# Patient Record
Sex: Female | Born: 1970 | Hispanic: Yes | Marital: Married | Smoking: Never smoker
Health system: Southern US, Community
[De-identification: ages and names within clinical notes are randomized; demographics above are authoritative.]

## PROBLEM LIST (undated history)

## (undated) DIAGNOSIS — Z789 Other specified health status: Secondary | ICD-10-CM

## (undated) DIAGNOSIS — D229 Melanocytic nevi, unspecified: Secondary | ICD-10-CM

## (undated) DIAGNOSIS — E559 Vitamin D deficiency, unspecified: Secondary | ICD-10-CM

## (undated) DIAGNOSIS — E785 Hyperlipidemia, unspecified: Secondary | ICD-10-CM

## (undated) HISTORY — DX: Hyperlipidemia, unspecified: E78.5

## (undated) HISTORY — DX: Vitamin D deficiency, unspecified: E55.9

## (undated) HISTORY — DX: Melanocytic nevi, unspecified: D22.9

## (undated) HISTORY — PX: NO PAST SURGERIES: SHX2092

## (undated) HISTORY — PX: CERVICAL POLYPECTOMY: SHX88

---

## 2006-09-03 ENCOUNTER — Emergency Department (HOSPITAL_COMMUNITY): Admission: EM | Admit: 2006-09-03 | Discharge: 2006-09-03 | Payer: Self-pay | Admitting: Family Medicine

## 2006-10-25 ENCOUNTER — Emergency Department (HOSPITAL_COMMUNITY): Admission: EM | Admit: 2006-10-25 | Discharge: 2006-10-26 | Payer: Self-pay | Admitting: Emergency Medicine

## 2010-10-15 ENCOUNTER — Encounter: Payer: Self-pay | Admitting: Obstetrics & Gynecology

## 2011-09-01 ENCOUNTER — Encounter (HOSPITAL_COMMUNITY): Payer: Self-pay

## 2011-09-01 ENCOUNTER — Inpatient Hospital Stay (HOSPITAL_COMMUNITY)
Admission: AD | Admit: 2011-09-01 | Discharge: 2011-09-01 | Disposition: A | Discharge status: Home / Self Care | Payer: PRIVATE HEALTH INSURANCE | Source: Ambulatory Visit | Attending: Obstetrics & Gynecology | Admitting: Obstetrics & Gynecology

## 2011-09-01 ENCOUNTER — Inpatient Hospital Stay (HOSPITAL_COMMUNITY): Payer: PRIVATE HEALTH INSURANCE

## 2011-09-01 DIAGNOSIS — O209 Hemorrhage in early pregnancy, unspecified: Secondary | ICD-10-CM | POA: Insufficient documentation

## 2011-09-01 DIAGNOSIS — R109 Unspecified abdominal pain: Secondary | ICD-10-CM | POA: Insufficient documentation

## 2011-09-01 LAB — URINALYSIS, ROUTINE W REFLEX MICROSCOPIC
Bilirubin Urine: NEGATIVE
Glucose, UA: NEGATIVE mg/dL
Ketones, ur: NEGATIVE mg/dL
Protein, ur: NEGATIVE mg/dL

## 2011-09-01 LAB — ABO/RH: ABO/RH(D): O POS

## 2011-09-01 LAB — HCG, QUANTITATIVE, PREGNANCY: hCG, Beta Chain, Quant, S: 2893 m[IU]/mL — ABNORMAL HIGH (ref <5)

## 2011-09-01 LAB — URINE MICROSCOPIC-ADD ON

## 2011-09-01 LAB — CBC
MCHC: 33.8 g/dL (ref 30.0–36.0)
RDW: 15 % (ref 11.5–15.5)

## 2011-09-01 LAB — WET PREP, GENITAL
Clue Cells Wet Prep HPF POC: NONE SEEN
Trich, Wet Prep: NONE SEEN
Yeast Wet Prep HPF POC: NONE SEEN

## 2011-09-01 NOTE — MAU Note (Signed)
Pt reports having some vaginal bleeding that started yesterday. Reports bleeding is heavier now and having some cramping.

## 2011-09-01 NOTE — MAU Provider Note (Signed)
History     CSN: [id number]  Arrival date and time: 09/01/11 1601   First Provider Initiated Contact with Patient 09/01/11 [phone]      Chief Complaint  Patient presents with  . Vaginal Bleeding   HPI Tina [place] 41 y.o. 6w 3d gestation, having cramping and bleeding today.  No prenatal care yet.  Came for evaluation.  OB History    Grav Para Term Preterm Abortions TAB SAB Ect Mult Living   2 1        1       No past medical history on file.  No past surgical history on file.  No family history on file.  History  Substance Use Topics  . Smoking status: Never Smoker   . Smokeless tobacco: Not on file  . Alcohol Use: No    Allergies: No Known Allergies  Prescriptions prior to admission  Medication Sig Dispense Refill  . Prenatal Vit-Fe Fumarate-FA (PRENATAL MULTIVITAMIN) TABS Take 1 tablet by mouth daily.        Review of Systems  Constitutional: Negative for fever.  Gastrointestinal: Positive for abdominal pain. Negative for nausea, vomiting, diarrhea and constipation.  Genitourinary: Negative for dysuria.       Vaginal bleeding   Physical Exam   Blood pressure 147/79, pulse 98, temperature 99 F (37.2 C), temperature source Oral, resp. rate 18, height 5\' 4"  (1.626 m), weight 151 lb 3.2 oz (68.584 kg), last menstrual period 07/18/2011.  Physical Exam  Nursing note and vitals reviewed. Constitutional: She is oriented to person, place, and time. She appears well-developed and well-nourished.  HENT:  Head: Normocephalic.  Eyes: EOM are normal.  Neck: Neck supple.  GI: Soft. There is no tenderness.  Genitourinary:       Speculum exam: Vulva - negative Vagina - Small amount of blood, no odor Cervix - No contact bleeding, two cervical polyps noted Bimanual exam: Cervix int os closed Uterus non tender, enlarged - 6 week size, to the left in the plevis Adnexa non tender, no masses bilaterally GC/Chlam, wet prep done Chaperone present for exam.    Musculoskeletal: Normal range of motion.  Neurological: She is alert and oriented to person, place, and time.  Skin: Skin is warm and dry.  Psychiatric: She has a normal mood and affect.    MAU Course  Procedures Results for orders placed during the hospital encounter of 09/01/11 (from the past 24 hour(s))  URINALYSIS, ROUTINE W REFLEX MICROSCOPIC     Status: Abnormal   Collection Time   09/01/11  9:02 AM      Component Value Range   Color, Urine YELLOW  YELLOW    APPearance CLEAR  CLEAR    Specific Gravity, Urine 1.010  1.005 - 1.030    pH 6.0  5.0 - 8.0    Glucose, UA NEGATIVE  NEGATIVE (mg/dL)   Hgb urine dipstick MODERATE (*) NEGATIVE    Bilirubin Urine NEGATIVE  NEGATIVE    Ketones, ur NEGATIVE  NEGATIVE (mg/dL)   Protein, ur NEGATIVE  NEGATIVE (mg/dL)   Urobilinogen, UA 0.2  0.0 - 1.0 (mg/dL)   Nitrite NEGATIVE  NEGATIVE    Leukocytes, UA NEGATIVE  NEGATIVE   URINE MICROSCOPIC-ADD ON     Status: Abnormal   Collection Time   09/01/11  9:02 AM      Component Value Range   Squamous Epithelial / LPF FEW (*) RARE    RBC / HPF 7-10  <3 (RBC/hpf)  POCT PREGNANCY, URINE  Status: Abnormal   Collection Time   09/01/11  9:11 AM      Component Value Range   Preg Test, Ur POSITIVE (*) NEGATIVE   WET PREP, GENITAL     Status: Abnormal   Collection Time   09/01/11  9:20 AM      Component Value Range   Yeast Wet Prep HPF POC NONE SEEN  NONE SEEN    Trich, Wet Prep NONE SEEN  NONE SEEN    Clue Cells Wet Prep HPF POC NONE SEEN  NONE SEEN    WBC, Wet Prep HPF POC RARE (*) NONE SEEN   HCG, QUANTITATIVE, PREGNANCY     Status: Abnormal   Collection Time   09/01/11  9:30 AM      Component Value Range   hCG, Beta Chain, Quant, S 2893 (*) <5 (mIU/mL)  CBC     Status: Normal   Collection Time   09/01/11  9:30 AM      Component Value Range   WBC 6.6  4.0 - 10.5 (K/uL)   RBC 4.53  3.87 - 5.11 (MIL/uL)   Hemoglobin 13.4  12.0 - 15.0 (g/dL)   HCT 16.1  09.6 - 04.5 (%)   MCV 87.4  78.0  - 100.0 (fL)   MCH 29.6  26.0 - 34.0 (pg)   MCHC 33.8  30.0 - 36.0 (g/dL)   RDW 40.9  81.1 - 91.4 (%)   Platelets 248  150 - 400 (K/uL)  ABO/RH     Status: Normal   Collection Time   09/01/11  9:30 AM      Component Value Range   ABO/RH(D) O POS     MDM Clinical Data: Vaginal bleeding. Last menstrual period 07/18/2011.  OBSTETRIC <14 WK [place] AND TRANSVAGINAL OB US  Technique: Both transabdominal and transvaginal ultrasound  examinations were performed for complete evaluation of the  gestation as well as the maternal uterus, adnexal regions, and  pelvic cul-de-sac. Transvaginal technique was performed to assess  early pregnancy.  Comparison: No priors.  Intrauterine gestational sac: A single intrauterine gestational  sac is noted, somewhat flattened in appearance, with a mean sac  diameter of approximately 8.6 mm, corresponding with an estimated  gestational age of approximately 5 weeks 4 days.  Yolk sac: Present measuring 4 mm.  Embryo: None visualized.  Cardiac Activity: None observed.  Heart Rate: N/A  Maternal uterus/adnexae:  Multiple small hypoechoic regions are noted in the uterus, likely  representing small fibroids. The largest of these is pedunculated,  and extends into the region of the right adnexa, measuring  approximately 2.6 x 1.8 x 3.1 cm. The ovaries are normal in  echotexture appearance bilaterally, containing multiple small  follicles. Right ovary measures 2.8 x 1.5 x 2.3 cm. The left  ovary measures 3.1 x 1.6 x 2.5 cm. No free fluid in the cul-de-sac.  IMPRESSION:  1. While there is a small gestational sac identified within the  endometrial cavity, the sac is misshapen. There is a yolk sac  visualized, but no fetal pole and no identifiable cardiac activity.  Findings suggest a blighted ovum. Clinical correlation is  recommended.  2. The appearance of the ovaries is normal bilaterally.  3. Multiple small fibroids, the largest of which is pedunculated    extending into the region of the right adnexa measuring  approximately 2.6 x 1.8 x 3.1 cm.   1055  Consult with Dr. Marice Potter  Assessment and Plan  5w 4d IUGS with yolk sac -  misshapen and question if this is a viable pregnancy Bleeding in early pregnancy  Plan No smoking, no drugs, no alcohol.  Take a prenatal vitamin one by mouth every day.  Eat small frequent snacks to avoid nausea.  Begin prenatal care as soon as possible. Repeat ultrasound in one week - order sent to ultrasound to call client   [facility] 09/01/2011, 11:00 AM

## 2011-09-01 NOTE — Discharge Instructions (Signed)
No smoking, no drugs, no alcohol.  Take a prenatal vitamin one by mouth every day.  Eat small frequent snacks to avoid nausea.  Begin prenatal care as soon as possible.  Prenatal Care Providers [facility]    [facility]  Phone- [phone]     Phone: [phone]          [facility]                      [facility]  @[place]     Phone: [phone]  Phone: [phone]         [facility]     Phone: [phone]  Phone: [phone]           [facility] [place]                hone: [phone]  Phone: [phone]         [facility] [address] [address]      Phone: [phone]  Phone: [phone]         [facility]                Phone: [phone]  [facility]   Phone:[phone]    [facility])          Phone: [phone] [facility]   Phone: [phone]      ________________________________________     To schedule your Maternity Eligibility Appointment, please call [phone].  When you arrive for your appointment you must bring the following items or information listed below.  Your appointment will be rescheduled if you do not have these items or are 15 minutes late. If currently receiving Medicaid, you MUST bring: 1. Medicaid Card 2. Social Security Card 3. Picture ID 4. Proof of Pregnancy 5. Verification of current address if the address on Medicaid card is incorrect "postmarked mail" If not receiving Medicaid, you MUST bring: 1. Social Security Card 2. Picture ID 3. Birth Certificate (if available) Passport or *Green Card 4. Proof of Pregnancy 5. Verification of current address "postmarked mail" for each income presented. 6. Verification of insurance coverage, if any 7. Check stubs from each employer for the previous month (if unable to present  check stub  for each week, we will accept check stub for the first and last week ill the same month.) If you can't locate check stubs, you must bring a letter from the employer(s) and it must have the following information on letterhead, typed, in English: o name of company o company telephone number o how long been with the company, if less than one month o how much person earns per hour o how many hours per week work o the gross pay the person earned for the previous month If you are 42 years old or less, you do not have to bring proof of income unless you work or live with the father of the baby and at that time we will need proof of income from you and/or the father of the baby. Green Card recipients are eligible for Medicaid for Pregnant Women (MPW)    

## 2011-09-02 ENCOUNTER — Encounter (HOSPITAL_COMMUNITY): Payer: Self-pay | Admitting: *Deleted

## 2011-09-02 ENCOUNTER — Inpatient Hospital Stay (HOSPITAL_COMMUNITY)
Admission: AD | Admit: 2011-09-02 | Discharge: 2011-09-02 | Disposition: A | Discharge status: Hospice | Payer: 59 | Source: Ambulatory Visit | Attending: Obstetrics & Gynecology | Admitting: Obstetrics & Gynecology

## 2011-09-02 DIAGNOSIS — O039 Complete or unspecified spontaneous abortion without complication: Secondary | ICD-10-CM

## 2011-09-02 DIAGNOSIS — O034 Incomplete spontaneous abortion without complication: Secondary | ICD-10-CM

## 2011-09-02 HISTORY — DX: Other specified health status: Z78.9

## 2011-09-02 LAB — URINALYSIS, ROUTINE W REFLEX MICROSCOPIC
Bilirubin Urine: NEGATIVE
Glucose, UA: NEGATIVE mg/dL
Protein, ur: NEGATIVE mg/dL
Specific Gravity, Urine: <1.005 — ABNORMAL LOW (ref 1.005–1.030)
Urobilinogen, UA: 0.2 mg/dL (ref 0.0–1.0)

## 2011-09-02 LAB — URINE MICROSCOPIC-ADD ON

## 2011-09-02 NOTE — MAU Note (Signed)
Bleeding yesterday, came to MAU, had u/s with IUP identified.  Will come for repeat u/s in one week.  Bleeding worsened today accompanied with lower abd cramping.  At present, bleeding has slowed compared to what it was at home this afternoon.

## 2011-09-02 NOTE — MAU Note (Signed)
About ago, had sudden onset of sharp pain. Bleeding has gotten heavier, no clots.  Pain is not as bad now.

## 2011-09-02 NOTE — ED Provider Notes (Signed)
  History     CSN: [id number]  Arrival date and time: 09/02/11 1533   None     Chief Complaint  Patient presents with  . Abdominal Pain  . Vaginal Bleeding   Patient is a 41 y.o. female presenting with abdominal pain and vaginal bleeding.  Abdominal Pain The primary symptoms of the illness include abdominal pain and vaginal bleeding.  Vaginal Bleeding Associated symptoms include abdominal pain.  Abdominal Pain  Vaginal Bleeding Associated symptoms include abdominal pain.  Bleeding began yesterday and she was evaluated in MAU at that time.  Had work up with US done which showed misshapen sac with no fetal pole, ?blighted ovum.  Last night and earlier today had increasing abdominal pain and bleeding.  This has now tapered to light bleeding and minimal pain.  She is soaking <1 pad per hour.  No associated trauma, nausea, vomiting.    OB History    Grav Para Term Preterm Abortions TAB SAB Ect Mult Living   2 1        1       Past Medical History  Diagnosis Date  . No pertinent past medical history     Past Surgical History  Procedure Date  . No past surgeries     Family History  Problem Relation Age of Onset  . Diabetes Mother   . Hypertension Father   . Arthritis Sister   . Diabetes Maternal Aunt   . Diabetes Maternal Uncle   . Diabetes Paternal Aunt   . Diabetes Paternal Uncle   . Hypertension Paternal Uncle   . Diabetes Maternal Grandmother   . Diabetes Maternal Grandfather     History  Substance Use Topics  . Smoking status: Never Smoker   . Smokeless tobacco: Not on file  . Alcohol Use: No    Allergies: No Known Allergies  Prescriptions prior to admission  Medication Sig Dispense Refill  . Prenatal Vit-Fe Fumarate-FA (PRENATAL MULTIVITAMIN) TABS Take 1 tablet by mouth daily.        Review of Systems  Gastrointestinal: Positive for abdominal pain.  Genitourinary: Positive for vaginal bleeding.   Physical Exam   Blood pressure 119/72, pulse 92,  temperature 98.2 F (36.8 C), temperature source Oral, resp. rate 20, height 5\' 5"  (1.651 m), weight 153 lb (69.4 kg), last menstrual period 07/18/2011, SpO2 100.00%.  Physical Exam  Constitutional: She appears well-developed and well-nourished. No distress.  Genitourinary:       Speculum exam deferred since she had done on 3/31 and bleeding is slowed significantly.     MAU Course  Procedures  MDM - Likely incomplete spontaneous abortion given findings on [place] and subsequent increased bleeding and pain    Assessment and Plan  Incomplete spontaneous abortion   -Will have her return tomorrow am for [place] and repeat quant HCG.  MATTHEWS,CODY 09/02/2011, 4:28 PM  Care assumed by Sharen Counter, CNM  P: D/C home with bleeding precautions Return to MAU tomorrow at 10:15 for repeat quantitative HCG U/S scheduled for 10:45 am tomorrow as outpatient  LEFTWICH-KIRBY, Demarea Lorey Certified Nurse-Midwife

## 2011-09-02 NOTE — ED Provider Notes (Signed)
Attestation of Attending Supervision of Advanced Practitioner: Evaluation and management procedures were performed by the PA/NP/CNM/OB Fellow under my supervision/collaboration. Chart reviewed, and agree with management and plan.  Jaynie Collins, M.D. 09/02/2011 6:50 PM

## 2011-09-02 NOTE — Discharge Instructions (Signed)
Please return to MAU at 10:15 am for labs.  Your ultrasound is scheduled for 10:45 am tomorrow.   Aborto incompleto (Incomplete Miscarriage) Los abortos son algo frecuente en los [place]. Le han diagnosticado que ha sufrido un aborto incompleto. Se trata de un embarazo que ha finalizado antes de la vigsima semana y [place] partes del feto o de la placenta han quedado en el tero. En algunos casos se requiere Pharmacist, community. La razn ms frecuente para Education officer, environmental un tratamiento es el sangrado (hemorragia) continuo que generalmente ocurre. Tambin puede [employer] tejidos que no se han expulsado se infecten. El tratamiento generalmente se realiza con un curetaje. El curetaje se lleva a cabo en el aborto incompleto como procedimiento para extraer los tejidos del embarazo que no se han expulsado. Se realizar por medio de un sencillo procedimiento de succin (curetaje por succin) o por medio de un raspado simple del interior del tero. Podr llevarse a cabo en el hospital o en el consultorio del profesional. Slo se efecta cuando el profesional tiene la certeza de que el embarazo se ha interrumpido. La certeza se determina a travs del examen fsico, una prueba de Psychiatrist negativa y por medio de una ecografa que revele la existencia de un feto muero o partes del embarazo que hubieran quedado en el tero. En ocasiones, cuando el cuello del tero queda dilatado y an se eliminan cogulos y tejidos, el profesional podr esperar para ver si elimina todos los tejidos sin necesidad de Education officer, environmental un procedimiento. Si la hemorragia persiste, podr proceder inmediatamente con el curetaje. POR QU ME SIENTO DE ESTE MODO Un aborto suele ser un perodo muy emotivo para las futuras mams; sin embargo, Engineer, maintenance (IT) consuelo teniendo en cuenta que no es su culpa ni la de su pareja. No ocurre por conductas inapropiadas por parte suya o de su compaero. Casi todos los abortos se producen porque el embarazo ha comenzado  de un modo incorrecto. Al menos en la mitad de estos embarazos, cuando se los estudia, existe una anormalidad cromosmica (casi siempre no hereditaria). Otros podran presentar problemas en el desarrollo del feto o de la placenta, los que no necesariamente se observan en los productos del aborto cuando se estudian al microscopio. El aborto casi nunca se produce por su culpa. Casi siempre puede tratar de embarazarse nuevamente tan pronto como el profesional la autorice. INSTRUCCIONES PARA EL CUIDADO DOMICILIARIO  El profesional puede indicarle reposo en cama (slo podr levantarse para ir al cuarto de bao), o podr permitirle que contine realizando tareas livianas. Si el curetaje no se realiza en [employer], podr Financial risk analyst sus cosas con respecto al cuidado de los nios y a otras responsabilidades en caso de un tratamiento ms intensivo o de hospitalizacin.   Lleve un registro de la cantidad y la saturacin de las toallas higinicas (cunto absorben) que Landscape architect. [place] esta informacin.   No usetampones. No se haga duchas vaginales ni tenga relaciones sexuales hasta que el mdico la autorice.   Es muy importante que cumpla con todas las citas de seguimiento para una nueva evaluacin y para Administrator, Civil Service.   Las mujeres con tipo sanguneo Rh negativo (A, B, AB o 0 negativo deben recibir una droga denominada inmuno globulina Rh (D) (RhoGam ) Este medicamento ayuda a proteger al feto de futuros problemas que puedan ocurrir si una madre Rh negativo da a luz a un beb Rh positivo.  CONCIERTE UNA CITA CON EL MDICO, O CON EL PROFESIONAL AL  QUE LA HAN DERIVADO O VAYA A LA SALA DE EMERGENCIAS INMEDIATAMENTE SI:  Siente clicos intensos en el estmago, en la espalda o en el abdomen.   Presenta una temperatura elevada inexplicable (antela).   Elimina cogulos grandes o tejidos (conserve una parte de los tejidos para [employer] observe).   La hemorragia aumenta  o se siente mareada, dbil o tiene episodios de desmayos.  EST SEGURO QUE:   Comprende las instrucciones para el alta mdica.   Controlar su enfermedad.   Solicitar atencin mdica de inmediato segn las indicaciones.  Document Released: 05/20/2005 Document Revised: 05/09/2011 [facility] Patient Information 2012 [place], [place].

## 2011-09-03 ENCOUNTER — Encounter (HOSPITAL_COMMUNITY): Payer: Self-pay | Admitting: *Deleted

## 2011-09-03 ENCOUNTER — Inpatient Hospital Stay (HOSPITAL_COMMUNITY)
Admission: AD | Admit: 2011-09-03 | Discharge: 2011-09-03 | Disposition: A | Discharge status: Home / Self Care | Payer: Managed Care, Other (non HMO) | Source: Ambulatory Visit | Attending: Family Medicine | Admitting: Family Medicine

## 2011-09-03 ENCOUNTER — Ambulatory Visit (HOSPITAL_COMMUNITY)
Admission: RE | Admit: 2011-09-03 | Discharge: 2011-09-03 | Disposition: A | Discharge status: Home / Self Care | Payer: 59 | Source: Ambulatory Visit | Attending: Obstetrics & Gynecology | Admitting: Obstetrics & Gynecology

## 2011-09-03 DIAGNOSIS — O034 Incomplete spontaneous abortion without complication: Secondary | ICD-10-CM

## 2011-09-03 DIAGNOSIS — O039 Complete or unspecified spontaneous abortion without complication: Secondary | ICD-10-CM

## 2011-09-03 LAB — HCG, QUANTITATIVE, PREGNANCY: hCG, Beta Chain, Quant, S: 942 m[IU]/mL — ABNORMAL HIGH (ref <5)

## 2011-09-03 NOTE — MAU Note (Signed)
Sent over from ultrasound, reports less bleeding today then yesterday, has only used 3 pads that are lightly saturated.  Denies any pain at the moment, occas abdominal cramping.

## 2011-09-03 NOTE — MAU Provider Note (Signed)
History    Pt presents today for f/u after experiencing some pain and bleeding in early pregnancy. She states she is doing well and has had some improvement of her sx.   CSN: [id number]  Arrival date and time: 09/03/11 1111   None     Chief Complaint  Patient presents with  . Vaginal Bleeding   HPI  OB History    Grav Para Term Preterm Abortions TAB SAB Ect Mult Living   2 1        1       Past Medical History  Diagnosis Date  . No pertinent past medical history     Past Surgical History  Procedure Date  . No past surgeries     Family History  Problem Relation Age of Onset  . Diabetes Mother   . Hypertension Father   . Arthritis Sister   . Diabetes Maternal Aunt   . Diabetes Maternal Uncle   . Diabetes Paternal Aunt   . Diabetes Paternal Uncle   . Hypertension Paternal Uncle   . Diabetes Maternal Grandmother   . Diabetes Maternal Grandfather     History  Substance Use Topics  . Smoking status: Never Smoker   . Smokeless tobacco: Not on file  . Alcohol Use: No    Allergies: No Known Allergies  Prescriptions prior to admission  Medication Sig Dispense Refill  . Multiple Vitamins-Minerals (MULTIVITAMIN WITH MINERALS) tablet Take 1 tablet by mouth daily.      . Prenatal Vit-Fe Fumarate-FA (PRENATAL MULTIVITAMIN) TABS Take 1 tablet by mouth daily.        Review of Systems  Constitutional: Negative for fever and chills.  Eyes: Negative for blurred vision and double vision.  Respiratory: Negative for cough, hemoptysis, sputum production, shortness of breath and wheezing.   Cardiovascular: Negative for chest pain and palpitations.  Gastrointestinal: Negative for nausea, vomiting, abdominal pain, diarrhea and constipation.  Genitourinary: Negative for dysuria, urgency and frequency.  Neurological: Negative for dizziness and headaches.  Psychiatric/Behavioral: Negative for depression and suicidal ideas.   Physical Exam   Blood pressure 128/79, pulse 81,  temperature 98.2 F (36.8 C), temperature source Oral, resp. rate 18, height 5\' 5"  (1.651 m), weight 153 lb (69.4 kg), last menstrual period 07/18/2011.  Physical Exam  Nursing note and vitals reviewed. Constitutional: She is oriented to person, place, and time. She appears well-developed and well-nourished. No distress.  HENT:  Head: Normocephalic and atraumatic.  Eyes: EOM are normal. Pupils are equal, round, and reactive to light.  GI: Soft. She exhibits no distension and no mass. There is no tenderness. There is no rebound and no guarding.  Neurological: She is alert and oriented to person, place, and time.  Skin: Skin is warm and dry. She is not diaphoretic.  Psychiatric: She has a normal mood and affect. Her behavior is normal. Judgment and thought content normal.    MAU Course  Procedures  Results for orders placed during the hospital encounter of 09/03/11 (from the past 72 hour(s))  HCG, QUANTITATIVE, PREGNANCY     Status: Abnormal   Collection Time   09/03/11 11:33 AM      Component Value Range Comment   hCG, Beta Chain, Quant, S 942 (*) <5 (mIU/mL)      Assessment and Plan  SAB: pt has had a significant decrease in her B-quant. She will return in 1wk for repeat B-quant. Discussed diet, activity, risks, and precautions.  Clinton Gallant. Kesha Hurrell III, DrHSc, MPAS, PA-C  09/03/2011,  12:33 PM

## 2011-09-03 NOTE — MAU Provider Note (Signed)
Chart reviewed and agree with management and plan.  

## 2011-09-03 NOTE — Discharge Instructions (Signed)
Miscarriage (Spontaneous Miscarriage)  A miscarriage is when you lose your baby before the twentieth week of pregnancy. Miscarriages happen in 15-20% of pregnancies. Most miscarriages happen in the first 13 weeks of the pregnancy. In medical terms, this is called a spontaneous miscarriage or early pregnancy loss. No further treatment is needed when the miscarriage is complete and all products of conception have been passed out of the body. You can begin trying for another pregnancy as soon as your caregiver says it is okay.  CAUSES    Most causes are not known.   Genetic problems like abnormal, not enough or too many chromosomes.   Infection of the cervix or uterus.   An abnormal shaped uterus, fibroid tumors or congenital abnormalities.   Hormone problems.   Medical problems.   Incompetent cervix, the tissue in the cervix is not strong enough to hold the pregnancy.   Smoking, too much alcohol use and illegal drugs.   Trauma.  SYMPTOMS    Bleeding or spotting from the vagina.   Cramping of the lower abdomen.   Passing of fluid from the vagina with or without cramps or pain.   Passing fetal tissue.  TREATMENT    Sometimes no further treatment is necessary if you pass all the tissue in the uterus.   If partial parts of the fetus or placenta remain in the body (incomplete miscarriage), tissue left behind may become infected. Usually a D and C (Dilatation and Curettage) suction or scrapping of the uterus is necessary to remove the remaining tissue in uterus. The procedure is only done when your caregiver knows that there is no chance for the pregnancy to continue. This is determined by a physical exam, a negative pregnancy test, blood tests and perhaps an ultrasound revealing a dead fetus or no fetus developing because a problem occurred at conception (when the sperm and egg unite).   Medications may be necessary, antibiotics if there is an infection or medications to contract the uterus if there is a  lot of bleeding.   If you have Rh negative blood and your partner is Rh positive, you will need a Rho-gam shot (an immune globulin vaccine). This will protect your baby from having Rh blood problems in future pregnancies.  HOME CARE INSTRUCTIONS    Your caregiver may order bed rest (up to the bathroom only). He or she may allow you to continue light activity. You may need to make arrangements for the care of children and for any other responsibilities.   Keep track of the number of pads you use each day and how soaked (saturated) they are. Record this information.   Do not use tampons. Do not douche or have sexual intercourse until approved by your caregiver.   Only take over-the-counter or prescription medicines for pain, discomfort or fever as directed by your caregiver.   Do not take aspirin because it can cause bleeding.   It is very important to keep all follow-up appointments for re-evaluations and continuing management.   Tell your caregiver if you are experiencing domestic violence.   Women who have an Rh negative blood type (i.e., A, B, AB, or O negative) need to receive a drug called Rh(D) immune globulin (RhoGam). This medicine helps protect future fetuses against problems that can occur if an Rh negative mother is carrying a baby who is Rh positive.   If you and/or your partner are having problems with guilt or grieving, talk to your caregiver or seek counseling to help   you cope with the pregnancy loss. Allow enough time to grieve before trying to get pregnant again.  SEEK IMMEDIATE MEDICAL CARE IF:    You have severe cramps or pain in your stomach, back, or belly (abdomen).   You have a fever.   You pass large clots or tissue. Save any tissue for your caregiver to inspect.   Your bleeding increases.   You become light-headed, weak or have fainting episodes.   You develop chills.  Document Released: 11/13/2000 Document Revised: 05/09/2011 Document Reviewed: 12/21/2007  Exit[employer] Patient  Information 2012 [employer].

## 2011-09-11 ENCOUNTER — Encounter (HOSPITAL_COMMUNITY): Payer: Self-pay | Admitting: Advanced Practice Midwife

## 2011-09-11 ENCOUNTER — Inpatient Hospital Stay (HOSPITAL_COMMUNITY)
Admission: AD | Admit: 2011-09-11 | Discharge: 2011-09-11 | Disposition: A | Discharge status: Home / Self Care | Payer: PRIVATE HEALTH INSURANCE | Source: Ambulatory Visit | Attending: Family Medicine | Admitting: Family Medicine

## 2011-09-11 DIAGNOSIS — O039 Complete or unspecified spontaneous abortion without complication: Secondary | ICD-10-CM

## 2011-09-11 LAB — HCG, QUANTITATIVE, PREGNANCY: hCG, Beta Chain, Quant, S: 25 m[IU]/mL — ABNORMAL HIGH (ref <5)

## 2011-09-11 NOTE — MAU Provider Note (Signed)
  History     CSN: [id number]  Arrival date and time: 09/11/11 0831   None     Chief Complaint  Patient presents with  . Follow-up   HPI 41 y.o. G2P1011 here for f/u quant after SAB. No bleeding or pain today.   3/31: quant 2893, u/s: flattened IUGS 5.4 week size 4/2: quant 942, u/s: no IUGS    Past Medical History  Diagnosis Date  . No pertinent past medical history     Past Surgical History  Procedure Date  . No past surgeries     Family History  Problem Relation Age of Onset  . Diabetes Mother   . Hypertension Father   . Arthritis Sister   . Diabetes Maternal Aunt   . Diabetes Maternal Uncle   . Diabetes Paternal Aunt   . Diabetes Paternal Uncle   . Hypertension Paternal Uncle   . Diabetes Maternal Grandmother   . Diabetes Maternal Grandfather     History  Substance Use Topics  . Smoking status: Never Smoker   . Smokeless tobacco: Not on file  . Alcohol Use: No    Allergies: No Known Allergies  Prescriptions prior to admission  Medication Sig Dispense Refill  . Multiple Vitamins-Minerals (MULTIVITAMIN WITH MINERALS) tablet Take 1 tablet by mouth daily.      . Prenatal Vit-Fe Fumarate-FA (PRENATAL MULTIVITAMIN) TABS Take 1 tablet by mouth daily.        Review of Systems  Constitutional: Negative.   Respiratory: Negative.   Cardiovascular: Negative.   Gastrointestinal: Negative for nausea, vomiting, abdominal pain, diarrhea and constipation.  Genitourinary: Negative for dysuria, urgency, frequency, hematuria and flank pain.       Negative for vaginal bleeding, vaginal discharge  Musculoskeletal: Negative.   Neurological: Negative.   Psychiatric/Behavioral: Negative.    Physical Exam   Blood pressure 114/68, pulse 71, temperature 97.5 F (36.4 C), temperature source Oral, resp. rate 16, last menstrual period 07/18/2011, SpO2 100.00%.  Physical Exam  Nursing note and vitals reviewed. Constitutional: She is oriented to person, place, and  time. She appears well-developed and well-nourished. No distress.  Cardiovascular: Normal rate.   Respiratory: Effort normal.  GI: Soft. There is no tenderness.  Musculoskeletal: Normal range of motion.  Neurological: She is alert and oriented to person, place, and time.  Skin: Skin is warm and dry.  Psychiatric: She has a normal mood and affect.    MAU Course  Procedures  Results for orders placed during the hospital encounter of 09/11/11 (from the past 24 hour(s))  HCG, QUANTITATIVE, PREGNANCY     Status: Abnormal   Collection Time   09/11/11  9:02 AM      Component Value Range   hCG, Beta Chain, Quant, S 25 (*) <5 (mIU/mL)     Assessment and Plan  41 y.o. G2P1 at [redacted]w[redacted]d Complete SAB - no f/u required  [facility] 09/11/2011, 9:44 AM

## 2011-09-11 NOTE — MAU Provider Note (Signed)
Chart reviewed and agree with management and plan.  

## 2011-09-11 NOTE — MAU Note (Signed)
Patient to MAU for repeat BHCG. Patient denies any pain or bleeding.  

## 2014-04-04 ENCOUNTER — Encounter (HOSPITAL_COMMUNITY): Payer: Self-pay | Admitting: Advanced Practice Midwife

## 2014-12-28 DIAGNOSIS — R232 Flushing: Secondary | ICD-10-CM | POA: Insufficient documentation

## 2016-07-24 DIAGNOSIS — N841 Polyp of cervix uteri: Secondary | ICD-10-CM

## 2016-07-24 HISTORY — DX: Polyp of cervix uteri: N84.1

## 2017-01-01 DIAGNOSIS — N926 Irregular menstruation, unspecified: Secondary | ICD-10-CM | POA: Insufficient documentation

## 2017-12-09 ENCOUNTER — Encounter (HOSPITAL_COMMUNITY): Payer: Self-pay | Admitting: Obstetrics and Gynecology

## 2018-01-01 HISTORY — PX: CERVICAL POLYPECTOMY: SHX88

## 2019-03-31 ENCOUNTER — Telehealth (HOSPITAL_COMMUNITY): Payer: Self-pay

## 2019-03-31 NOTE — Telephone Encounter (Signed)
Telephoned patient at mobile number.  Mailbox full at this time, unable to schedule appt with [facility].

## 2019-06-09 LAB — HM DIABETES EYE EXAM

## 2019-12-15 ENCOUNTER — Ambulatory Visit: Payer: PRIVATE HEALTH INSURANCE | Admitting: Family Medicine

## 2019-12-29 ENCOUNTER — Encounter: Payer: Self-pay | Admitting: Family Medicine

## 2019-12-29 ENCOUNTER — Ambulatory Visit: Payer: PRIVATE HEALTH INSURANCE | Admitting: Family Medicine

## 2020-01-05 ENCOUNTER — Encounter: Payer: Self-pay | Admitting: Family Medicine

## 2020-01-05 ENCOUNTER — Other Ambulatory Visit: Payer: Self-pay

## 2020-01-05 ENCOUNTER — Telehealth: Payer: Self-pay

## 2020-01-05 ENCOUNTER — Ambulatory Visit (INDEPENDENT_AMBULATORY_CARE_PROVIDER_SITE_OTHER): Payer: 59 | Admitting: Family Medicine

## 2020-01-05 VITALS — BP 106/71 | HR 78 | Temp 98.1°F | Resp 18 | Ht 65.0 in | Wt 160.2 lb

## 2020-01-05 DIAGNOSIS — Z130 Encounter for screening for diseases of the blood and blood-forming organs and certain disorders involving the immune mechanism: Secondary | ICD-10-CM

## 2020-01-05 DIAGNOSIS — Z1159 Encounter for screening for other viral diseases: Secondary | ICD-10-CM

## 2020-01-05 DIAGNOSIS — Z0000 Encounter for general adult medical examination without abnormal findings: Secondary | ICD-10-CM

## 2020-01-05 DIAGNOSIS — Z13220 Encounter for screening for lipoid disorders: Secondary | ICD-10-CM | POA: Diagnosis not present

## 2020-01-05 DIAGNOSIS — Z1231 Encounter for screening mammogram for malignant neoplasm of breast: Secondary | ICD-10-CM

## 2020-01-05 DIAGNOSIS — Z23 Encounter for immunization: Secondary | ICD-10-CM

## 2020-01-05 DIAGNOSIS — E559 Vitamin D deficiency, unspecified: Secondary | ICD-10-CM | POA: Insufficient documentation

## 2020-01-05 DIAGNOSIS — R232 Flushing: Secondary | ICD-10-CM | POA: Diagnosis not present

## 2020-01-05 DIAGNOSIS — Z131 Encounter for screening for diabetes mellitus: Secondary | ICD-10-CM | POA: Diagnosis not present

## 2020-01-05 DIAGNOSIS — Z1211 Encounter for screening for malignant neoplasm of colon: Secondary | ICD-10-CM | POA: Diagnosis not present

## 2020-01-05 DIAGNOSIS — Z114 Encounter for screening for human immunodeficiency virus [HIV]: Secondary | ICD-10-CM

## 2020-01-05 LAB — LIPID PANEL
Cholesterol: 229 mg/dL — ABNORMAL HIGH (ref 0–200)
HDL: 53.4 mg/dL (ref >39.00)
LDL Cholesterol: 157 mg/dL — ABNORMAL HIGH (ref 0–99)
NonHDL: 175.21
Total CHOL/HDL Ratio: 4
Triglycerides: 89 mg/dL (ref 0.0–149.0)
VLDL: 17.8 mg/dL (ref 0.0–40.0)

## 2020-01-05 LAB — COMPREHENSIVE METABOLIC PANEL
ALT: 17 U/L (ref 0–35)
AST: 18 U/L (ref 0–37)
Albumin: 4.1 g/dL (ref 3.5–5.2)
Alkaline Phosphatase: 60 U/L (ref 39–117)
BUN: 13 mg/dL (ref 6–23)
CO2: 25 mEq/L (ref 19–32)
Calcium: 9.3 mg/dL (ref 8.4–10.5)
Chloride: 106 mEq/L (ref 96–112)
Creatinine, Ser: 0.67 mg/dL (ref 0.40–1.20)
GFR: 92.96 mL/min (ref >60.00)
Glucose, Bld: 79 mg/dL (ref 70–99)
Potassium: 4.3 mEq/L (ref 3.5–5.1)
Sodium: 137 mEq/L (ref 135–145)
Total Bilirubin: 0.6 mg/dL (ref 0.2–1.2)
Total Protein: 6.7 g/dL (ref 6.0–8.3)

## 2020-01-05 LAB — CBC
HCT: 43.2 % (ref 36.0–46.0)
Hemoglobin: 14.3 g/dL (ref 12.0–15.0)
MCHC: 33.1 g/dL (ref 30.0–36.0)
MCV: 87.6 fl (ref 78.0–100.0)
Platelets: 246 10*3/uL (ref 150.0–400.0)
RBC: 4.93 Mil/uL (ref 3.87–5.11)
RDW: 14.7 % (ref 11.5–15.5)
WBC: 4.9 10*3/uL (ref 4.0–10.5)

## 2020-01-05 LAB — HEMOGLOBIN A1C: Hgb A1c MFr Bld: 5.5 % (ref 4.6–6.5)

## 2020-01-05 LAB — TSH: TSH: 0.88 u[IU]/mL (ref 0.35–4.50)

## 2020-01-05 NOTE — Progress Notes (Signed)
Patient ID: Tina Boyd, female  DOB: [birth year], 49 y.o.   MRN: [id number] Patient Care Team    Relationship Specialty Notifications Start End  Ma Hillock, DO PCP - General Family Medicine  01/05/20     Chief Complaint  Patient presents with   New Patient (Initial Visit)    Pt states needing a CPE and states no paperwork  [place] interpreter present today. Pt does not feel she would need a interpreter in the future.   Subjective:  Tina Boyd is a 49 y.o.  female present for new patient establishment. All past medical history, surgical history, allergies, family history, immunizations, medications and social history were updated in the electronic medical record today. All recent labs, ED visits and hospitalizations within the last year were reviewed. Pt reports irregular menstrual cycles and hot flashes that occur mostly at night and negatively impact sleep.   Health maintenance:  Colonoscopy: no prior> referral made today to GI per her preference.  Mammogram: Pt reports completed 2 yrs ago at her GYN office near [facility]. She will have records sent. Order placed for [facility] Cervical cancer screening: last pap: last in system 07/24/2016 with neg HPV and NL pap. She states she has had another one at her GYN > but would like to have future PAP completed with CPE w/ this provider.  Immunizations: tdap updated today, Influenza (encouraged yearly), covid counseled. Shingrix declined.  Infectious disease screening: HIV and Hep C offered today and pt would like screenings.  DEXA: routine screen Assistive device: none Oxygen ASN:KNLZ Patient has a Dental home. Hospitalizations/ED visits:  Depression screen [facility] 2/9 01/05/2020  Decreased Interest 0  Down, Depressed, Hopeless 0  PHQ - 2 Score 0   No flowsheet data found.     No flowsheet data found.   Immunization History  Administered Date(s) Administered   Tdap 08/11/2009, 01/05/2020    No exam  data present  Past Medical History:  Diagnosis Date   No pertinent past medical history    No Known Allergies Past Surgical History:  Procedure Laterality Date   NO PAST SURGERIES     Family History  Problem Relation Age of Onset   Diabetes Mother    Hypertension Mother    Hypertension Father    Arthritis Sister    Hypertension Sister    Diabetes Maternal Aunt    Diabetes Maternal Uncle    Diabetes Paternal Aunt    Diabetes Paternal Uncle    Hypertension Paternal Uncle    Diabetes Maternal Grandmother    Diabetes Maternal Grandfather    Social History   Social History Narrative   Not on file    Allergies as of 01/05/2020   No Known Allergies     Medication List       Accurate as of January 05, 2020  2:06 PM. If you have any questions, ask your nurse or doctor.        STOP taking these medications   prenatal multivitamin Tabs tablet Stopped by: Howard Pouch, DO     TAKE these medications   Collagen Hydrolysate Powd by Does not apply route. Per pt 1 scoop in the morning   multivitamin with minerals tablet Take 1 tablet by mouth daily.       All past medical history, surgical history, allergies, family history, immunizations andmedications were updated in the EMR today and reviewed under the history and medication portions of their EMR.    No results found for  this or any previous visit (from the past 2160 hour(s)).  No results found.   ROS: 14 pt review of systems performed and negative (unless mentioned in an HPI)  Objective: BP 106/71 (BP Location: Right Arm, Patient Position: Sitting, Cuff Size: Normal)    Pulse 78    Temp 98.1 F (36.7 C) (Oral)    Resp 18    Ht 5' 5"  (1.651 m)    Wt 160 lb 3.2 oz (72.7 kg)    LMP 12/12/2019    SpO2 99%    Breastfeeding Unknown    BMI 26.66 kg/m  Gen: Afebrile. No acute distress. Nontoxic in appearance, well-developed, well-nourished,  Pleasant female. HENT: AT. [place]. Bilateral TM visualized and normal  in appearance, normal external auditory canal. MMM, no oral lesions, adequate dentition. Bilateral nares within normal limits. Throat without erythema, ulcerations or exudates. no Cough on exam, no hoarseness on exam. Eyes:Pupils Equal Round Reactive to light, Extraocular movements intact,  Conjunctiva without redness, discharge or icterus. Neck/lymp/endocrine: Supple,no lymphadenopathy, no thyromegaly CV: RRR no murmur, no edema, +2/4 P posterior tibialis pulses. No JVD. Chest: CTAB, no wheeze, rhonchi or crackles. normal Respiratory effort. good Air movement. Abd: Soft. flat. NTND. BS present. no Masses palpated. No hepatosplenomegaly. No rebound tenderness or guarding. Skin: no rashes, purpura or petechiae. Warm and well-perfused. Skin intact. Neuro/Msk:  Normal gait. PERLA. EOMi. Alert. Oriented x3.  Cranial nerves II through XII intact. Muscle strength 5/5 upper/lower extremity. DTRs equal bilaterally. Psych: Normal affect, dress and demeanor. Normal speech. Normal thought content and judgment.   Assessment/plan: Tina Boyd is a 49 y.o. female present for  Encounter for medical examination to establish care Colon cancer screening - Ambulatory referral to Gastroenterology Encounter for screening mammogram for malignant neoplasm of breast - MM 3D SCREEN BREAST BILATERAL; Future Need for hepatitis C screening test - Hepatitis C Antibody Encounter for screening for HIV - HIV antibody (with reflex) Diabetes mellitus screening - Comp Met (CMET) - Hemoglobin A1c Lipid screening - Lipid panel  Hot flashes Discussed r/o thyroid disorder. If labs normal> will prescribe celexa for her to start.  - TSH  Screening for deficiency anemia - CBC  Need for tetanus, diphtheria, and acellular pertussis (Tdap) vaccine - Tdap vaccine greater than or equal to 7yo IM  Encounter for preventive health examination Patient was encouraged to exercise greater than 150 minutes a week. Patient was  encouraged to choose a diet filled with fresh fruits and vegetables, and lean meats. AVS provided to patient today for education/recommendation on gender specific health and safety maintenance. Colonoscopy: no prior> referral made today to GI per her preference.  Mammogram: Pt reports completed 2 yrs ago at her GYN office near [facility]. She will have records sent. Order placed for [facility] Cervical cancer screening: last pap: last in system 07/24/2016 with neg HPV and NL pap. She states she has had another one at her GYN > but would like to have future PAP completed with CPE w/ this provider.  Immunizations: tdap updated today, Influenza (encouraged yearly), covid counseled. Shingrix declined.  Infectious disease screening: HIV and Hep C offered today and pt would like screenings.  DEXA: routine screen  Return in about 1 year (around 01/04/2021) for CPE (30 min).  Orders Placed This Encounter  Procedures   MM 3D SCREEN BREAST BILATERAL   Tdap vaccine greater than or equal to 7yo IM   Hepatitis C Antibody   HIV antibody (with reflex)   CBC   TSH  Comp Met (CMET)   Lipid panel   Hemoglobin A1c   Ambulatory referral to Gastroenterology   No orders of the defined types were placed in this encounter.   Referral Orders     Ambulatory referral to Gastroenterology   Note is dictated utilizing voice recognition software. Although note has been proof read prior to signing, occasional typographical errors still can be missed. If any questions arise, please do not hesitate to call for verification.  Electronically signed by: Howard Pouch, DO [place]

## 2020-01-05 NOTE — Telephone Encounter (Signed)
Patient gave the phone # [phone]. The office name is [facility].

## 2020-01-05 NOTE — Telephone Encounter (Signed)
Sent as FYI. 

## 2020-01-05 NOTE — Telephone Encounter (Signed)
Please make sure records requested from that office for mammograms and Pap smears

## 2020-01-05 NOTE — Patient Instructions (Signed)
Please call us with the name of your Gynecologist that completed mammograms and PAP smears so we can request those records.  GI doctor will call you to get you scheduled for colon cancer screening. The [facility] center of [place] )located on the corner of [address]) will call you to schedule mammogram.  Your tetanus shot was updated today.  I would encouraged you to consider having your COVID shot at your local pharmacy.  Once labs result we will call you with those and if normal will call in the medication for hot flashes for you.   Followup yearly for physicals. Sooner if needed.   Health Maintenance, Female Adopting a healthy lifestyle and getting preventive care are important in promoting health and wellness. Ask your health care provider about:  The right schedule for you to have regular tests and exams.  Things you can do on your own to prevent diseases and keep yourself healthy. What should I know about diet, weight, and exercise? Eat a healthy diet   Eat a diet that includes plenty of vegetables, fruits, low-fat dairy products, and lean protein.  Do not eat a lot of foods that are high in solid fats, added sugars, or sodium. Maintain a healthy weight Body mass index (BMI) is used to identify weight problems. It estimates body fat based on height and weight. Your health care provider can help determine your BMI and help you achieve or maintain a healthy weight. Get regular exercise Get regular exercise. This is one of the most important things you can do for your health. Most adults should:  Exercise for at least 150 minutes each week. The exercise should increase your heart rate and make you sweat (moderate-intensity exercise).  Do strengthening exercises at least twice a week. This is in addition to the moderate-intensity exercise.  Spend less time sitting. Even light physical activity can be beneficial. Watch cholesterol and blood lipids Have your blood  tested for lipids and cholesterol at 50 years of age, then have this test every 5 years. Have your cholesterol levels checked more often if:  Your lipid or cholesterol levels are high.  You are older than 50 years of age.  You are at high risk for heart disease. What should I know about cancer screening? Depending on your health history and family history, you may need to have cancer screening at various ages. This may include screening for:  Breast cancer.  Cervical cancer.  Colorectal cancer.  Skin cancer.  Lung cancer. What should I know about heart disease, diabetes, and high blood pressure? Blood pressure and heart disease  High blood pressure causes heart disease and increases the risk of stroke. This is more likely to develop in people who have high blood pressure readings, are of African descent, or are overweight.  Have your blood pressure checked: ? Every 3-5 years if you are 44-25 years of age. ? Every year if you are 8 years old or older. Diabetes Have regular diabetes screenings. This checks your fasting blood sugar level. Have the screening done:  Once every three years after age 45 if you are at a normal weight and have a low risk for diabetes.  More often and at a younger age if you are overweight or have a high risk for diabetes. What should I know about preventing infection? Hepatitis B If you have a higher risk for hepatitis B, you should be screened for this virus. Talk with your health care provider to find  out if you are at risk for hepatitis B infection. Hepatitis C Testing is recommended for:  Everyone born from [birth year] through 1966.  Anyone with known risk factors for hepatitis C. Sexually transmitted infections (STIs)  Get screened for STIs, including gonorrhea and chlamydia, if: ? You are sexually active and are younger than 50 years of age. ? You are older than 50 years of age and your health care provider tells you that you are at risk for  this type of infection. ? Your sexual activity has changed since you were last screened, and you are at increased risk for chlamydia or gonorrhea. Ask your health care provider if you are at risk.  Ask your health care provider about whether you are at high risk for HIV. Your health care provider may recommend a prescription medicine to help prevent HIV infection. If you choose to take medicine to prevent HIV, you should first get tested for HIV. You should then be tested every 3 months for as long as you are taking the medicine. Pregnancy  If you are about to stop having your period (premenopausal) and you may become pregnant, seek counseling before you get pregnant.  Take 400 to 800 micrograms (mcg) of folic acid every day if you become pregnant.  Ask for birth control (contraception) if you want to prevent pregnancy. Osteoporosis and menopause Osteoporosis is a disease in which the bones lose minerals and strength with aging. This can result in bone fractures. If you are 34 years old or older, or if you are at risk for osteoporosis and fractures, ask your health care provider if you should:  Be screened for bone loss.  Take a calcium or vitamin D supplement to lower your risk of fractures.  Be given hormone replacement therapy (HRT) to treat symptoms of menopause. Follow these instructions at home: Lifestyle  Do not use any products that contain nicotine or tobacco, such as cigarettes, e-cigarettes, and chewing tobacco. If you need help quitting, ask your health care provider.  Do not use street drugs.  Do not share needles.  Ask your health care provider for help if you need support or information about quitting drugs. Alcohol use  Do not drink alcohol if: ? Your health care provider tells you not to drink. ? You are pregnant, may be pregnant, or are planning to become pregnant.  If you drink alcohol: ? Limit how much you use to 0-1 drink a day. ? Limit intake if you are  breastfeeding.  Be aware of how much alcohol is in your drink. In the [place] one drink equals one 12 oz bottle of beer (355 mL), one 5 oz glass of wine (148 mL), or one 1 oz glass of hard liquor (44 mL). General instructions  Schedule regular health, dental, and eye exams.  Stay current with your vaccines.  Tell your health care provider if: ? You often feel depressed. ? You have ever been abused or do not feel safe at home. Summary  Adopting a healthy lifestyle and getting preventive care are important in promoting health and wellness.  Follow your health care provider's instructions about healthy diet, exercising, and getting tested or screened for diseases.  Follow your health care provider's instructions on monitoring your cholesterol and blood pressure. This information is not intended to replace advice given to you by your health care provider. Make sure you discuss any questions you have with your health care provider. Document Revised: 05/13/2018 Document Reviewed: 05/13/2018 Elsevier Patient Education  2020 Elsevier Inc.  

## 2020-01-06 ENCOUNTER — Telehealth: Payer: Self-pay | Admitting: Family Medicine

## 2020-01-06 DIAGNOSIS — E782 Mixed hyperlipidemia: Secondary | ICD-10-CM

## 2020-01-06 LAB — HEPATITIS C ANTIBODY
Hepatitis C Ab: NONREACTIVE
SIGNAL TO CUT-OFF: 0 (ref <1.00)

## 2020-01-06 LAB — HIV ANTIBODY (ROUTINE TESTING W REFLEX): HIV 1&2 Ab, 4th Generation: NONREACTIVE

## 2020-01-06 MED ORDER — CITALOPRAM HYDROBROMIDE 20 MG PO TABS: 20.0000 mg | ORAL_TABLET | Freq: Every day | ORAL | 1 refills | Status: DC

## 2020-01-06 NOTE — Telephone Encounter (Signed)
Can one of you request records from [place] for this patient?

## 2020-01-06 NOTE — Telephone Encounter (Signed)
Please call patient (she does not need interpreter) Liver, kidney and thyroid function are normal Blood cell counts and electrolytes are normal Diabetes screening/A1c is normal at 5.5 Hepatitis C and HIV screening tests are negative.  Cholesterol panel is above goal.  Her LDL/bad cholesterol is 157.  For her age bracket/risk factors anything above 160 would meet criteria for a cholesterol-lowering medication. For now, I would encourage her to attempt to exercise routinely with at least 150 minutes of exercise weekly.  Try to follow a Mediterranean diet.  A mediterranean diet is high in fruits, vegetables, whole grains, fish, chicken, nuts, healthy fats (olive oil or canola oil). Low fat dairy.  Limit butter, margarine, red meat and sweets.  She can also try an over-the-counter supplement called red yeast rice which helps lower cholesterol.  Lastly, I did call in the medication called citalopram to start before bed for hot flashes.  This has to be taken daily and may take a few weeks before she sees if it is going to be effective for her or if she will require a different dose.  Follow-up in 3 months fasting for hyperlipidemia and hot flashes and we will repeat her cholesterol panel at that time.  If LDL has not improved we will discuss start of medication to help her lower her cholesterol.

## 2020-01-07 NOTE — Telephone Encounter (Signed)
LMTCB

## 2020-01-11 NOTE — Telephone Encounter (Signed)
Faxed record request.

## 2020-01-11 NOTE — Telephone Encounter (Signed)
Patient returned call and advised of results.

## 2020-01-11 NOTE — Telephone Encounter (Signed)
LM for pt to returncall

## 2020-01-13 ENCOUNTER — Telehealth: Payer: Self-pay

## 2020-01-13 NOTE — Telephone Encounter (Signed)
Office notes placed in Dr. Raoul Pitch office from [facility] for review.

## 2020-01-21 ENCOUNTER — Encounter: Payer: Self-pay | Admitting: Family Medicine

## 2020-01-26 ENCOUNTER — Ambulatory Visit: Payer: 59

## 2020-02-02 ENCOUNTER — Ambulatory Visit
Admission: RE | Admit: 2020-02-02 | Discharge: 2020-02-02 | Disposition: A | Discharge status: Home / Self Care | Payer: 59 | Source: Ambulatory Visit | Attending: Family Medicine | Admitting: Family Medicine

## 2020-02-02 ENCOUNTER — Ambulatory Visit: Payer: PRIVATE HEALTH INSURANCE | Admitting: Family Medicine

## 2020-02-02 ENCOUNTER — Other Ambulatory Visit: Payer: Self-pay

## 2020-02-02 DIAGNOSIS — Z1231 Encounter for screening mammogram for malignant neoplasm of breast: Secondary | ICD-10-CM

## 2020-02-04 NOTE — Progress Notes (Signed)
Called interpreter Robbi Garter [phone]); left detailed voicemail per [facility]

## 2020-02-18 ENCOUNTER — Encounter: Payer: Self-pay | Admitting: Gastroenterology

## 2020-02-21 ENCOUNTER — Emergency Department (HOSPITAL_BASED_OUTPATIENT_CLINIC_OR_DEPARTMENT_OTHER): Payer: 59

## 2020-02-21 ENCOUNTER — Emergency Department (HOSPITAL_BASED_OUTPATIENT_CLINIC_OR_DEPARTMENT_OTHER)
Admission: EM | Admit: 2020-02-21 | Discharge: 2020-02-21 | Disposition: A | Discharge status: Home / Self Care | Payer: 59 | Attending: Emergency Medicine | Admitting: Emergency Medicine

## 2020-02-21 ENCOUNTER — Encounter (HOSPITAL_BASED_OUTPATIENT_CLINIC_OR_DEPARTMENT_OTHER): Payer: Self-pay | Admitting: *Deleted

## 2020-02-21 ENCOUNTER — Other Ambulatory Visit: Payer: Self-pay

## 2020-02-21 DIAGNOSIS — Y92410 Unspecified street and highway as the place of occurrence of the external cause: Secondary | ICD-10-CM | POA: Diagnosis not present

## 2020-02-21 DIAGNOSIS — R0789 Other chest pain: Secondary | ICD-10-CM | POA: Diagnosis not present

## 2020-02-21 DIAGNOSIS — Y9389 Activity, other specified: Secondary | ICD-10-CM | POA: Diagnosis not present

## 2020-02-21 DIAGNOSIS — M25511 Pain in right shoulder: Secondary | ICD-10-CM | POA: Insufficient documentation

## 2020-02-21 MED ORDER — METHOCARBAMOL 500 MG PO TABS: 500.0000 mg | ORAL_TABLET | Freq: Two times a day (BID) | ORAL | 0 refills | Status: DC

## 2020-02-21 MED ORDER — NAPROXEN 250 MG PO TABS: 500.0000 mg | ORAL_TABLET | Freq: Two times a day (BID) | ORAL | 0 refills | Status: DC
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
## 2020-02-21 NOTE — Discharge Instructions (Signed)
The pain you are experiencing is likely due to muscle strain, you may take Naprosyn and Robaxin as needed for pain management. Do not combine with any pain reliever other than tylenol.  You may also use ice and heat, and over-the-counter remedies such as Biofreeze gel or salon pas lidocaine patches. The muscle soreness should improve over the next week. Follow up with your family doctor in the next week for a recheck if you are still having symptoms. Return to ED if pain is worsening, you develop weakness or numbness of extremities, or new or concerning symptoms develop.
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
## 2020-03-29 ENCOUNTER — Encounter: Payer: Self-pay | Admitting: Family Medicine

## 2020-03-29 ENCOUNTER — Other Ambulatory Visit: Payer: Self-pay

## 2020-03-29 ENCOUNTER — Ambulatory Visit: Payer: 59 | Admitting: Family Medicine

## 2020-03-29 VITALS — BP 112/72 | HR 80 | Temp 98.6°F | Ht 65.0 in | Wt 158.0 lb

## 2020-03-29 DIAGNOSIS — Z23 Encounter for immunization: Secondary | ICD-10-CM | POA: Diagnosis not present

## 2020-03-29 DIAGNOSIS — S46912A Strain of unspecified muscle, fascia and tendon at shoulder and upper arm level, left arm, initial encounter: Secondary | ICD-10-CM

## 2020-03-29 DIAGNOSIS — S161XXA Strain of muscle, fascia and tendon at neck level, initial encounter: Secondary | ICD-10-CM | POA: Diagnosis not present

## 2020-03-29 MED ORDER — CYCLOBENZAPRINE HCL 5 MG PO TABS: 2.5000 mg | ORAL_TABLET | Freq: Every day | ORAL | 0 refills | Status: DC

## 2020-03-29 NOTE — Patient Instructions (Addendum)
Rest. Heat. Massage. Stretches. Physical therapy and muscle relaxer recommended before bed.    Cervical Sprain  A cervical sprain is a stretch or tear in the tissues that connect bones (ligaments) in the neck. Most neck (cervical) sprains get better in 4-6 weeks. Follow these instructions at home: If you have a neck collar:  Wear it as told by your doctor. Do not take off (do not remove) the collar unless your doctor says that this is safe.  Ask your doctor before adjusting your collar.  If you have long hair, keep it outside of the collar.  Ask your doctor if you may take off the collar for cleaning and bathing. If you may take off the collar: ? Follow instructions from your doctor about how to take off the collar safely. ? Clean the collar by wiping it with mild soap and water. Let it air-dry all the way. ? If your collar has removable pads:  Take the pads out every 1-2 days.  Hand wash the pads with soap and water.  Let the pads air-dry all the way before you put them back in the collar. Do not dry them in a clothes dryer. Do not dry them with a hair dryer. ? Check your skin under the collar for irritation or sores. If you see any, tell your doctor. Managing pain, stiffness, and swelling   Use a cervical traction device, if told by your doctor.  If told, put heat on the affected area. Do this before exercises (physical therapy) or as often as told by your doctor. Use the heat source that your doctor recommends, such as a moist heat pack or a heating pad. ? Place a towel between your skin and the heat source. ? Leave the heat on for 20-30 minutes. ? Take the heat off (remove the heat) if your skin turns bright red. This is very important if you cannot feel pain, heat, or cold. You may have a greater risk of getting burned.  Put ice on the affected area. ? Put ice in a plastic bag. ? Place a towel between your skin and the bag. ? Leave the ice on for 20 minutes, 2-3 times a  day. Activity  Do not drive while wearing a neck collar. If you do not have a neck collar, ask your doctor if it is safe to drive.  Do not drive or use heavy machinery while taking prescription pain medicine or muscle relaxants, unless your doctor approves.  Do not lift anything that is heavier than 10 lb (4.5 kg) until your doctor tells you that it is safe.  Rest as told by your doctor.  Avoid activities that make you feel worse. Ask your doctor what activities are safe for you.  Do exercises as told by your doctor or physical therapist. Preventing neck sprain  Practice good posture. Adjust your workstation to help with this, if needed.  Exercise regularly as told by your doctor or physical therapist.  Avoid activities that are risky or may cause a neck sprain (cervical sprain). General instructions  Take over-the-counter and prescription medicines only as told by your doctor.  Do not use any products that contain nicotine or tobacco. This includes cigarettes and e-cigarettes. If you need help quitting, ask your doctor.  Keep all follow-up visits as told by your doctor. This is important. Contact a doctor if:  You have pain or other symptoms that get worse.  You have symptoms that do not get better after 2 weeks.  You have pain that does not get better with medicine.  You start to have new, unexplained symptoms.  You have sores or irritated skin from wearing your neck collar. Get help right away if:  You have very bad pain.  You have any of the following in any part of your body: ? Loss of feeling (numbness). ? Tingling. ? Weakness.  You cannot move a part of your body (you have paralysis).  Your activity level does not improve. Summary  A cervical sprain is a stretch or tear in the tissues that connect bones (ligaments) in the neck.  If you have a neck (cervical) collar, do not take off the collar unless your doctor says that this is safe.  Put ice on  affected areas as told by your doctor.  Put heat on affected areas as told by your doctor.  Good posture and regular exercise can help prevent a neck sprain from happening again. This information is not intended to replace advice given to you by your health care provider. Make sure you discuss any questions you have with your health care provider. Document Revised: 09/09/2018 Document Reviewed: 01/30/2016 Elsevier Patient Education  [place].
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
## 2020-04-05 ENCOUNTER — Other Ambulatory Visit: Payer: Self-pay

## 2020-04-05 ENCOUNTER — Ambulatory Visit (AMBULATORY_SURGERY_CENTER): Payer: Self-pay | Admitting: *Deleted

## 2020-04-05 VITALS — Ht 65.0 in | Wt 155.0 lb

## 2020-04-05 DIAGNOSIS — Z1211 Encounter for screening for malignant neoplasm of colon: Secondary | ICD-10-CM

## 2020-04-05 DIAGNOSIS — Z01818 Encounter for other preprocedural examination: Secondary | ICD-10-CM

## 2020-04-05 MED ORDER — NA SULFATE-K SULFATE-MG SULF 17.5-3.13-1.6 GM/177ML PO SOLN: ORAL | 0 refills | Status: DC

## 2020-04-05 NOTE — Progress Notes (Signed)
Patient is here in-person for PV. Patient denies any allergies to eggs or soy. Patient denies any problems with anesthesia/sedation. Patient denies any oxygen use at home. Patient denies taking any diet/weight loss medications or blood thinners. Patient is not being treated for MRSA or C-diff. Patient is aware of our care-partner policy and [id number] safety protocol. EMMI education assisgned to the patient for the procedure, sent to MyChart.   COVID-19 test on 11/15 at 9 am-pt is aware.

## 2020-04-12 LAB — HM PAP SMEAR: HM Pap smear: NORMAL

## 2020-04-12 LAB — RESULTS CONSOLE HPV: CHL HPV: NEGATIVE

## 2020-04-13 NOTE — Progress Notes (Signed)
Ob records updated

## 2020-04-14 ENCOUNTER — Telehealth: Payer: Self-pay

## 2020-04-14 DIAGNOSIS — Z1211 Encounter for screening for malignant neoplasm of colon: Secondary | ICD-10-CM

## 2020-04-14 NOTE — Addendum Note (Signed)
Addended by: Kavin Leech on: 04/14/2020 04:17 PM   Modules accepted: Orders

## 2020-04-14 NOTE — Telephone Encounter (Signed)
Referral not entered by you but is it okay to enter new referral and change to external

## 2020-04-14 NOTE — Telephone Encounter (Signed)
Referral changed. Pt notified via VM

## 2020-04-14 NOTE — Telephone Encounter (Signed)
Patient request for referral to [facility] to do her colonoscopy.  Patient aware referral will be entered next week. Please follow up with patient when entered.

## 2020-04-14 NOTE — Telephone Encounter (Signed)
Ok to change

## 2020-04-19 ENCOUNTER — Encounter: Payer: 59 | Admitting: Gastroenterology

## 2021-01-10 ENCOUNTER — Encounter: Payer: 59 | Admitting: Family Medicine

## 2021-02-20 ENCOUNTER — Encounter: Payer: 59 | Admitting: Family Medicine

## 2021-02-22 DIAGNOSIS — M25521 Pain in right elbow: Secondary | ICD-10-CM | POA: Insufficient documentation

## 2021-02-22 DIAGNOSIS — S53104A Unspecified dislocation of right ulnohumeral joint, initial encounter: Secondary | ICD-10-CM

## 2021-02-22 HISTORY — DX: Unspecified dislocation of right ulnohumeral joint, initial encounter: S53.104A

## 2021-04-04 ENCOUNTER — Encounter: Payer: 59 | Admitting: Family Medicine

## 2021-06-21 ENCOUNTER — Encounter: Payer: Self-pay | Admitting: Family Medicine

## 2021-06-21 ENCOUNTER — Telehealth: Payer: Self-pay | Admitting: Family Medicine

## 2021-06-21 ENCOUNTER — Ambulatory Visit (INDEPENDENT_AMBULATORY_CARE_PROVIDER_SITE_OTHER): Payer: 59 | Admitting: Family Medicine

## 2021-06-21 DIAGNOSIS — R232 Flushing: Secondary | ICD-10-CM

## 2021-06-21 DIAGNOSIS — E7800 Pure hypercholesterolemia, unspecified: Secondary | ICD-10-CM

## 2021-06-21 DIAGNOSIS — Z131 Encounter for screening for diabetes mellitus: Secondary | ICD-10-CM

## 2021-06-21 DIAGNOSIS — Z130 Encounter for screening for diseases of the blood and blood-forming organs and certain disorders involving the immune mechanism: Secondary | ICD-10-CM

## 2021-06-21 DIAGNOSIS — Z0000 Encounter for general adult medical examination without abnormal findings: Secondary | ICD-10-CM

## 2021-06-21 NOTE — Progress Notes (Signed)
No show pt

## 2021-06-21 NOTE — Patient Instructions (Signed)
No show

## 2021-06-21 NOTE — Telephone Encounter (Signed)
no show, letter mailed & charge generated KO 06/21/2021

## 2021-08-09 ENCOUNTER — Ambulatory Visit (INDEPENDENT_AMBULATORY_CARE_PROVIDER_SITE_OTHER): Payer: 59 | Admitting: Family Medicine

## 2021-08-09 ENCOUNTER — Encounter: Payer: Self-pay | Admitting: Family Medicine

## 2021-08-09 ENCOUNTER — Other Ambulatory Visit: Payer: Self-pay

## 2021-08-09 VITALS — BP 99/61 | HR 84 | Temp 97.9°F | Ht 63.75 in | Wt 155.0 lb

## 2021-08-09 DIAGNOSIS — N926 Irregular menstruation, unspecified: Secondary | ICD-10-CM

## 2021-08-09 DIAGNOSIS — E7800 Pure hypercholesterolemia, unspecified: Secondary | ICD-10-CM

## 2021-08-09 DIAGNOSIS — Z0000 Encounter for general adult medical examination without abnormal findings: Secondary | ICD-10-CM | POA: Diagnosis not present

## 2021-08-09 DIAGNOSIS — Z1211 Encounter for screening for malignant neoplasm of colon: Secondary | ICD-10-CM | POA: Diagnosis not present

## 2021-08-09 DIAGNOSIS — Z1231 Encounter for screening mammogram for malignant neoplasm of breast: Secondary | ICD-10-CM

## 2021-08-09 LAB — CBC
HCT: 40 % (ref 36.0–46.0)
Hemoglobin: 13.2 g/dL (ref 12.0–15.0)
MCHC: 32.9 g/dL (ref 30.0–36.0)
MCV: 85.5 fl (ref 78.0–100.0)
Platelets: 243 10*3/uL (ref 150.0–400.0)
RBC: 4.68 Mil/uL (ref 3.87–5.11)
RDW: 16 % — ABNORMAL HIGH (ref 11.5–15.5)
WBC: 8.4 10*3/uL (ref 4.0–10.5)

## 2021-08-09 LAB — COMPREHENSIVE METABOLIC PANEL
ALT: 13 U/L (ref 0–35)
AST: 13 U/L (ref 0–37)
Albumin: 3.9 g/dL (ref 3.5–5.2)
Alkaline Phosphatase: 64 U/L (ref 39–117)
BUN: 14 mg/dL (ref 6–23)
CO2: 28 mEq/L (ref 19–32)
Calcium: 9.1 mg/dL (ref 8.4–10.5)
Chloride: 103 mEq/L (ref 96–112)
Creatinine, Ser: 0.6 mg/dL (ref 0.40–1.20)
GFR: 103.4 mL/min (ref >60.00)
Glucose, Bld: 85 mg/dL (ref 70–99)
Potassium: 4 mEq/L (ref 3.5–5.1)
Sodium: 139 mEq/L (ref 135–145)
Total Bilirubin: 0.5 mg/dL (ref 0.2–1.2)
Total Protein: 6.2 g/dL (ref 6.0–8.3)

## 2021-08-09 LAB — LIPID PANEL
Cholesterol: 181 mg/dL (ref 0–200)
HDL: 65.9 mg/dL (ref >39.00)
LDL Cholesterol: 99 mg/dL (ref 0–99)
NonHDL: 115.4
Total CHOL/HDL Ratio: 3
Triglycerides: 81 mg/dL (ref 0.0–149.0)
VLDL: 16.2 mg/dL (ref 0.0–40.0)

## 2021-08-09 LAB — HEMOGLOBIN A1C: Hgb A1c MFr Bld: 5.6 % (ref 4.6–6.5)

## 2021-08-09 LAB — TSH: TSH: 1 u[IU]/mL (ref 0.35–5.50)
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
## 2021-08-09 NOTE — Patient Instructions (Signed)

## 2021-08-16 ENCOUNTER — Ambulatory Visit: Payer: 59

## 2021-08-23 ENCOUNTER — Ambulatory Visit: Payer: 59

## 2021-08-30 ENCOUNTER — Ambulatory Visit
Admission: RE | Admit: 2021-08-30 | Discharge: 2021-08-30 | Disposition: A | Discharge status: Home / Self Care | Payer: Commercial Managed Care - PPO | Source: Ambulatory Visit | Attending: Family Medicine | Admitting: Family Medicine

## 2021-08-30 DIAGNOSIS — Z1231 Encounter for screening mammogram for malignant neoplasm of breast: Secondary | ICD-10-CM

## 2021-09-08 LAB — COLOGUARD: COLOGUARD: NEGATIVE

## 2021-09-11 ENCOUNTER — Telehealth: Payer: Self-pay | Admitting: Family Medicine

## 2021-09-11 NOTE — Telephone Encounter (Signed)
Pt calling about results, informed pt that someone will contact her. ? ? ?

## 2021-09-11 NOTE — Telephone Encounter (Signed)
Spoke with pt regarding labs and instructions.   

## 2022-08-13 ENCOUNTER — Encounter: Payer: Self-pay | Admitting: Family Medicine

## 2022-08-13 ENCOUNTER — Ambulatory Visit (INDEPENDENT_AMBULATORY_CARE_PROVIDER_SITE_OTHER): Payer: Commercial Managed Care - PPO | Admitting: Family Medicine

## 2022-08-13 VITALS — BP 111/77 | HR 70 | Temp 98.1°F | Ht 63.78 in | Wt 155.2 lb

## 2022-08-13 DIAGNOSIS — Z130 Encounter for screening for diseases of the blood and blood-forming organs and certain disorders involving the immune mechanism: Secondary | ICD-10-CM | POA: Diagnosis not present

## 2022-08-13 DIAGNOSIS — Z0000 Encounter for general adult medical examination without abnormal findings: Secondary | ICD-10-CM | POA: Diagnosis not present

## 2022-08-13 DIAGNOSIS — Z1231 Encounter for screening mammogram for malignant neoplasm of breast: Secondary | ICD-10-CM

## 2022-08-13 DIAGNOSIS — Z1211 Encounter for screening for malignant neoplasm of colon: Secondary | ICD-10-CM | POA: Diagnosis not present

## 2022-08-13 DIAGNOSIS — E7800 Pure hypercholesterolemia, unspecified: Secondary | ICD-10-CM

## 2022-08-13 DIAGNOSIS — Z131 Encounter for screening for diabetes mellitus: Secondary | ICD-10-CM | POA: Diagnosis not present

## 2022-08-13 LAB — COMPREHENSIVE METABOLIC PANEL
ALT: 17 U/L (ref 0–35)
AST: 16 U/L (ref 0–37)
Albumin: 3.8 g/dL (ref 3.5–5.2)
Alkaline Phosphatase: 65 U/L (ref 39–117)
BUN: 14 mg/dL (ref 6–23)
CO2: 27 mEq/L (ref 19–32)
Calcium: 9.4 mg/dL (ref 8.4–10.5)
Chloride: 103 mEq/L (ref 96–112)
Creatinine, Ser: 0.69 mg/dL (ref 0.40–1.20)
GFR: 99.27 mL/min (ref >60.00)
Glucose, Bld: 76 mg/dL (ref 70–99)
Potassium: 4.1 mEq/L (ref 3.5–5.1)
Sodium: 137 mEq/L (ref 135–145)
Total Bilirubin: 0.5 mg/dL (ref 0.2–1.2)
Total Protein: 6.2 g/dL (ref 6.0–8.3)

## 2022-08-13 LAB — LIPID PANEL
Cholesterol: 205 mg/dL — ABNORMAL HIGH (ref 0–200)
HDL: 62.1 mg/dL (ref >39.00)
LDL Cholesterol: 121 mg/dL — ABNORMAL HIGH (ref 0–99)
NonHDL: 142.77
Total CHOL/HDL Ratio: 3
Triglycerides: 110 mg/dL (ref 0.0–149.0)
VLDL: 22 mg/dL (ref 0.0–40.0)

## 2022-08-13 LAB — CBC
HCT: 42.3 % (ref 36.0–46.0)
Hemoglobin: 13.7 g/dL (ref 12.0–15.0)
MCHC: 32.5 g/dL (ref 30.0–36.0)
MCV: 86.3 fl (ref 78.0–100.0)
Platelets: 231 10*3/uL (ref 150.0–400.0)
RBC: 4.9 Mil/uL (ref 3.87–5.11)
RDW: 15.7 % — ABNORMAL HIGH (ref 11.5–15.5)
WBC: 6.9 10*3/uL (ref 4.0–10.5)

## 2022-08-13 LAB — HEMOGLOBIN A1C: Hgb A1c MFr Bld: 5.6 % (ref 4.6–6.5)

## 2022-08-13 LAB — TSH: TSH: 2.85 u[IU]/mL (ref 0.35–5.50)

## 2022-08-13 NOTE — Patient Instructions (Addendum)
Return in about 1 year (around 08/15/2023) for cpe (20 min).        Great to see you today.  I have refilled the medication(s) we provide.   If labs were collected, we will inform you of lab results once received either by echart message or telephone call.   - echart message- for normal results that have been seen by the patient already.   - telephone call: abnormal results or if patient has not viewed results in their echart.

## 2022-08-13 NOTE — Progress Notes (Signed)
Patient ID: Tina Boyd, female  DOB: 27-Aug-1970, 52 y.o.   MRN: [id number] Patient Care Team    Relationship Specialty Notifications Start End  Ma Hillock, DO PCP - General Family Medicine  01/05/20     Chief Complaint  Patient presents with   Annual Exam    [name]    [name] is a 52 y.o.  Female  present for CPE . All past medical history, surgical history, allergies, family history, immunizations, medications and social history were updated in the electronic medical record today. All recent labs, ED visits and hospitalizations within the last year were reviewed.  Health maintenance:  Colon cancer screening: Cologuard completed 08/31/2021.  Repeat in 3 years Mammogram: Completed 08/30/2021 [facility]> ordered Cervical cancer screening: last pap: last in system 04/2020 with neg HPV and NL pap. Due 2026 (pcp) Immunizations: tdap UTD 2021, Influenza (encouraged yearly), Shingrix declined.  Infectious disease screening: HIV and Hep C completed DEXA: routine screen Patient has a Dental home. Hospitalizations/ED visits: reviewed      08/13/2022    9:04 AM 08/09/2021    9:52 AM 03/29/2020   10:34 AM 01/05/2020   11:37 AM  Depression screen PHQ 2/9  Decreased Interest 0 0 0 0  Down, Depressed, Hopeless 0 0 0 0  PHQ - 2 Score 0 0 0 0       No data to display           Immunization History  Administered Date(s) Administered   Influenza,inj,Quad PF,6+ Mos 03/29/2020   Influenza-Unspecified 03/03/2021   Tdap 08/11/2009, 01/05/2020   Past Medical History:  Diagnosis Date   Dislocation of right elbow 02/22/2021   Endocervical polyp 07/24/2016   Hyperlipidemia    Nevus    Vitamin D deficiency    No Known Allergies Past Surgical History:  Procedure Laterality Date   CERVICAL POLYPECTOMY  01/2018   Family History  Problem Relation Age of Onset   Diabetes Mother    Hypertension Mother    Hypertension Father    Arthritis Sister     Hypertension Sister    Diabetes Maternal Aunt    Diabetes Maternal Uncle    Diabetes Paternal Aunt    Diabetes Paternal Uncle    Hypertension Paternal Uncle    Diabetes Maternal Grandmother    Diabetes Maternal Grandfather    Throat cancer Maternal Grandfather    Breast cancer Neg Hx    Colon cancer Neg Hx    Colon polyps Neg Hx    Esophageal cancer Neg Hx    Rectal cancer Neg Hx    Stomach cancer Neg Hx    Social History   Social History Narrative   Marital status/children/pets: married, 1 child        Allergies as of 08/13/2022   No Known Allergies      Medication List        Accurate as of August 13, 2022  9:11 AM. If you have any questions, ask your nurse or doctor.          multivitamin with minerals tablet Take 1 tablet by mouth daily.   VITA-C PO Take by mouth.   ZINC PO Take by mouth.        All past medical history, surgical history, allergies, family history, immunizations andmedications were updated in the EMR today and reviewed under the history and medication portions of their EMR.     No results found for this or any previous  visit (from the past 2160 hour(s)).   ROS 14 pt review of systems performed and negative (unless mentioned in an HPI)  Objective: BP 111/77   Pulse 70   Temp 98.1 F (36.7 C)   Ht 5' 3.78" (1.62 m)   Wt 155 lb 3.2 oz (70.4 kg)   SpO2 98%   BMI 26.82 kg/m  Physical Exam Vitals and nursing note reviewed.  Constitutional:      General: She is not in acute distress.    Appearance: Normal appearance. She is not ill-appearing or toxic-appearing.  HENT:     Head: Normocephalic and atraumatic.     Right Ear: Tympanic membrane, ear canal and external ear normal. There is no impacted cerumen.     Left Ear: Tympanic membrane, ear canal and external ear normal. There is no impacted cerumen.     Nose: No congestion or rhinorrhea.     Mouth/Throat:     Mouth: Mucous membranes are moist.     Pharynx: Oropharynx is  clear. No oropharyngeal exudate or posterior oropharyngeal erythema.  Eyes:     General: No scleral icterus.       Right eye: No discharge.        Left eye: No discharge.     Extraocular Movements: Extraocular movements intact.     Conjunctiva/sclera: Conjunctivae normal.     Pupils: Pupils are equal, round, and reactive to light.  Cardiovascular:     Rate and Rhythm: Normal rate and regular rhythm.     Pulses: Normal pulses.     Heart sounds: Normal heart sounds. No murmur heard.    No friction rub. No gallop.  Pulmonary:     Effort: Pulmonary effort is normal. No respiratory distress.     Breath sounds: Normal breath sounds. No stridor. No wheezing, rhonchi or rales.  Chest:     Chest wall: No tenderness.  Abdominal:     General: Abdomen is flat. Bowel sounds are normal. There is no distension.     Palpations: Abdomen is soft. There is no mass.     Tenderness: There is no abdominal tenderness. There is no right CVA tenderness, left CVA tenderness, guarding or rebound.     Hernia: No hernia is present.  Musculoskeletal:        General: No swelling, tenderness or deformity. Normal range of motion.     Cervical back: Normal range of motion and neck supple. No rigidity or tenderness.     Right lower leg: No edema.     Left lower leg: No edema.  Lymphadenopathy:     Cervical: No cervical adenopathy.  Skin:    General: Skin is warm and dry.     Coloration: Skin is not jaundiced or pale.     Findings: No bruising, erythema, lesion or rash.  Neurological:     General: No focal deficit present.     Mental Status: She is alert and oriented to person, place, and time. Mental status is at baseline.     Cranial Nerves: No cranial nerve deficit.     Sensory: No sensory deficit.     Motor: No weakness.     Coordination: Coordination normal.     Gait: Gait normal.     Deep Tendon Reflexes: Reflexes normal.  Psychiatric:        Mood and Affect: Mood normal.        Behavior: Behavior  normal.        Thought Content: Thought content normal.  Judgment: Judgment normal.      No results found.  Assessment/plan: Tina Boyd is a 52 y.o. female present for CPE  Routine general medical examination at a health care facility Patient was encouraged to exercise greater than 150 minutes a week. Patient was encouraged to choose a diet filled with fresh fruits and vegetables, and lean meats. AVS provided to patient today for education/recommendation on gender specific health and safety maintenance. Colono cancer screening: Cologuard completed 08/31/2021.  Repeat in 3 years Mammogram: Completed 08/30/2021 [facility]> ordered Cervical cancer screening: last pap: last in system 04/2020 with neg HPV and NL pap. Due 2026 (pcp) Immunizations: tdap UTD 2021, Influenza (encouraged yearly), Shingrix declined.  Infectious disease screening: HIV and Hep C completed DEXA: routine screen   Return in about 1 year (around 08/15/2023) for cpe (20 min).  Orders Placed This Encounter  Procedures   MM 3D SCREENING MAMMOGRAM BILATERAL BREAST   CBC   Comprehensive metabolic panel   Hemoglobin A1c   Lipid panel   TSH   No orders of the defined types were placed in this encounter.  Referral Orders  No referral(s) requested today     Electronically signed by: Howard Pouch, [place]

## 2022-10-04 ENCOUNTER — Ambulatory Visit
Admission: RE | Admit: 2022-10-04 | Discharge: 2022-10-04 | Disposition: A | Discharge status: Home / Self Care | Payer: Commercial Managed Care - PPO | Source: Ambulatory Visit | Attending: Family Medicine | Admitting: Family Medicine

## 2022-10-04 DIAGNOSIS — Z1231 Encounter for screening mammogram for malignant neoplasm of breast: Secondary | ICD-10-CM

## 2022-10-04 LAB — HM MAMMOGRAPHY

## 2022-12-17 ENCOUNTER — Ambulatory Visit: Payer: Commercial Managed Care - PPO | Admitting: Family Medicine

## 2022-12-17 ENCOUNTER — Encounter: Payer: Self-pay | Admitting: Family Medicine

## 2022-12-17 VITALS — BP 115/78 | HR 91 | Temp 98.3°F | Wt 158.2 lb

## 2022-12-17 DIAGNOSIS — L811 Chloasma: Secondary | ICD-10-CM

## 2022-12-17 NOTE — Progress Notes (Signed)
Tina Boyd , 1971/02/07, 52 y.o., female MRN: [id number] Patient Care Team    Relationship Specialty Notifications Start End  Natalia Leatherwood, DO PCP - General Family Medicine  01/05/20     Chief Complaint  Patient presents with   facial spots    Changes in color (spots) on face for the last few year     [name] is a 52 y.o. Pt presents for an OV with complaints of facial skin lesions that have started to become hyperpigmented of 2 years duration.  She has a past history of nevus.  No family history or personal history of skin cancer. Patient would like to have a dermatology referral placed for her. She uses spf 50 on her face.     08/13/2022    9:04 AM 08/09/2021    9:52 AM 03/29/2020   10:34 AM 01/05/2020   11:37 AM  Depression screen PHQ 2/9  Decreased Interest 0 0 0 0  Down, Depressed, Hopeless 0 0 0 0  PHQ - 2 Score 0 0 0 0    No Known Allergies Social History   Social History Narrative   Marital status/children/pets: married, 1 child       Past Medical History:  Diagnosis Date   Dislocation of right elbow 02/22/2021   Endocervical polyp 07/24/2016   Hyperlipidemia    Nevus    Vitamin D deficiency    Past Surgical History:  Procedure Laterality Date   CERVICAL POLYPECTOMY  01/2018   Family History  Problem Relation Age of Onset   Diabetes Mother    Hypertension Mother    Hypertension Father    Arthritis Sister    Hypertension Sister    Diabetes Maternal Aunt    Diabetes Maternal Uncle    Diabetes Paternal Aunt    Diabetes Paternal Uncle    Hypertension Paternal Uncle    Diabetes Maternal Grandmother    Diabetes Maternal Grandfather    Throat cancer Maternal Grandfather    Breast cancer Neg Hx    Colon cancer Neg Hx    Colon polyps Neg Hx    Esophageal cancer Neg Hx    Rectal cancer Neg Hx    Stomach cancer Neg Hx    Allergies as of 12/17/2022   No Known Allergies      Medication List        Accurate as of  December 17, 2022 10:34 AM. If you have any questions, ask your nurse or doctor.          STOP taking these medications    multivitamin with minerals tablet Stopped by: Felix Pacini   VITA-C PO Stopped by: Felix Pacini       TAKE these medications    ZINC PO Take by mouth.        All past medical history, surgical history, allergies, family history, immunizations andmedications were updated in the EMR today and reviewed under the history and medication portions of their EMR.     ROS Negative, with the exception of above mentioned in HPI   Objective:  BP 115/78   Pulse 91   Temp 98.3 F (36.8 C)   Wt 158 lb 3.2 oz (71.8 kg)   LMP 12/10/2022   SpO2 99%   BMI 27.34 kg/m  Body mass index is 27.34 kg/m. Physical Exam Vitals and nursing note reviewed.  Constitutional:      General: She is not in acute distress.    Appearance:  Normal appearance. She is normal weight. She is not ill-appearing or toxic-appearing.  HENT:     Head: Normocephalic and atraumatic.     Comments: Moderate sized flat  patches of hyperpigmentation bilateral face and forehead Eyes:     General: No scleral icterus.       Right eye: No discharge.        Left eye: No discharge.     Extraocular Movements: Extraocular movements intact.     Conjunctiva/sclera: Conjunctivae normal.     Pupils: Pupils are equal, round, and reactive to light.  Skin:    Findings: No rash.  Neurological:     Mental Status: She is alert and oriented to person, place, and time. Mental status is at baseline.     Motor: No weakness.     Coordination: Coordination normal.     Gait: Gait normal.  Psychiatric:        Mood and Affect: Mood normal.        Behavior: Behavior normal.        Thought Content: Thought content normal.        Judgment: Judgment normal.      No results found. No results found. No results found for this or any previous visit (from the past 24 hour(s)).  Assessment/Plan: Tina Boyd is  a 52 y.o. female present for OV for  Melasma Continue avoiding sun exposure and using spf 50  She is not prescribed any medications to cause.  We discussed referral to dermatology and she is agreeable  - Ambulatory referral to Dermatology- desires female provider.   Reviewed expectations re: course of current medical issues. Discussed self-management of symptoms. Outlined signs and symptoms indicating need for more acute intervention. Patient verbalized understanding and all questions were answered. Patient received an After-Visit Summary.    Orders Placed This Encounter  Procedures   Ambulatory referral to Dermatology   No orders of the defined types were placed in this encounter.  Referral Orders         Ambulatory referral to Dermatology       Note is dictated utilizing voice recognition software. Although note has been proof read prior to signing, occasional typographical errors still can be missed. If any questions arise, please do not hesitate to call for verification.   electronically signed by:  Felix Pacini, DO  [facility] [place]

## 2022-12-17 NOTE — Patient Instructions (Signed)
No follow-ups on file.        Great to see you today.    

## 2023-02-04 LAB — HM PAP SMEAR: HPV, high-risk: NEGATIVE

## 2023-02-04 LAB — RESULTS CONSOLE HPV: CHL HPV: NEGATIVE

## 2023-08-14 ENCOUNTER — Encounter: Payer: Commercial Managed Care - PPO | Admitting: Family Medicine

## 2023-09-16 ENCOUNTER — Encounter: Payer: Self-pay | Admitting: Family Medicine

## 2023-09-16 ENCOUNTER — Ambulatory Visit (INDEPENDENT_AMBULATORY_CARE_PROVIDER_SITE_OTHER): Admitting: Family Medicine

## 2023-09-16 VITALS — BP 120/74 | HR 88 | Temp 98.3°F | Wt 156.0 lb

## 2023-09-16 DIAGNOSIS — Z131 Encounter for screening for diabetes mellitus: Secondary | ICD-10-CM | POA: Diagnosis not present

## 2023-09-16 DIAGNOSIS — E7800 Pure hypercholesterolemia, unspecified: Secondary | ICD-10-CM | POA: Diagnosis not present

## 2023-09-16 DIAGNOSIS — Z1231 Encounter for screening mammogram for malignant neoplasm of breast: Secondary | ICD-10-CM

## 2023-09-16 DIAGNOSIS — R109 Unspecified abdominal pain: Secondary | ICD-10-CM

## 2023-09-16 DIAGNOSIS — Z0000 Encounter for general adult medical examination without abnormal findings: Secondary | ICD-10-CM

## 2023-09-16 DIAGNOSIS — E663 Overweight: Secondary | ICD-10-CM | POA: Diagnosis not present

## 2023-09-16 LAB — COMPREHENSIVE METABOLIC PANEL WITH GFR
ALT: 19 U/L (ref 0–35)
AST: 18 U/L (ref 0–37)
Albumin: 4.5 g/dL (ref 3.5–5.2)
Alkaline Phosphatase: 75 U/L (ref 39–117)
BUN: 14 mg/dL (ref 6–23)
CO2: 28 meq/L (ref 19–32)
Calcium: 9.6 mg/dL (ref 8.4–10.5)
Chloride: 103 meq/L (ref 96–112)
Creatinine, Ser: 0.62 mg/dL (ref 0.40–1.20)
GFR: 101.08 mL/min (ref >60.00)
Glucose, Bld: 95 mg/dL (ref 70–99)
Potassium: 4.5 meq/L (ref 3.5–5.1)
Sodium: 137 meq/L (ref 135–145)
Total Bilirubin: 0.7 mg/dL (ref 0.2–1.2)
Total Protein: 6.7 g/dL (ref 6.0–8.3)

## 2023-09-16 LAB — LIPID PANEL
Cholesterol: 215 mg/dL — ABNORMAL HIGH (ref 0–200)
HDL: 60.6 mg/dL (ref >39.00)
LDL Cholesterol: 136 mg/dL — ABNORMAL HIGH (ref 0–99)
NonHDL: 154.5
Total CHOL/HDL Ratio: 4
Triglycerides: 92 mg/dL (ref 0.0–149.0)
VLDL: 18.4 mg/dL (ref 0.0–40.0)

## 2023-09-16 LAB — CBC
HCT: 46 % (ref 36.0–46.0)
Hemoglobin: 15.1 g/dL — ABNORMAL HIGH (ref 12.0–15.0)
MCHC: 32.8 g/dL (ref 30.0–36.0)
MCV: 86.2 fl (ref 78.0–100.0)
Platelets: 276 10*3/uL (ref 150.0–400.0)
RBC: 5.34 Mil/uL — ABNORMAL HIGH (ref 3.87–5.11)
RDW: 17.1 % — ABNORMAL HIGH (ref 11.5–15.5)
WBC: 7.2 10*3/uL (ref 4.0–10.5)

## 2023-09-16 LAB — TSH: TSH: 1.25 u[IU]/mL (ref 0.35–5.50)

## 2023-09-16 LAB — HEMOGLOBIN A1C: Hgb A1c MFr Bld: 5.7 % (ref 4.6–6.5)

## 2023-09-16 NOTE — Patient Instructions (Addendum)

## 2023-09-16 NOTE — Progress Notes (Signed)
 Patient ID: Tina Boyd, female  DOB: [birth year], 53 y.o.   MRN: [id number] Patient Care Team    Relationship Specialty Notifications Start End  Natalia Leatherwood, DO PCP - General Family Medicine  01/05/20   Henreitta Leber, PA-C Physician Assistant Obstetrics and Gynecology  09/16/23   Henreitta Leber, PA-C Physician Assistant Obstetrics and Gynecology  09/16/23     Chief Complaint  Patient presents with   Annual Exam    [name].     [name] is a 53 y.o.  Female  present for CPE . All past medical history, surgical history, allergies, family history, immunizations, medications and social history were updated in the electronic medical record today. All recent labs, ED visits and hospitalizations within the last year were reviewed.  Health maintenance:  Colon cancer screening: Cologuard completed 08/31/2021.  Repeat in 3 years Mammogram: Completed 10/04/2022 [facility] ordered Cervical cancer screening: last pap: last in system ? Pt reports she had at gyn last year. with neg HPV and NL pap. Elmira powell PA-C Immunizations: tdap UTD 2021, Influenza (encouraged yearly), Shingrix declined.  Infectious disease screening: HIV and Hep C completed DEXA: routine screen Patient has a Dental home. Hospitalizations/ED visits: reviewed      09/16/2023    9:12 AM 08/13/2022    9:04 AM 08/09/2021    9:52 AM 03/29/2020   10:34 AM 01/05/2020   11:37 AM  Depression screen PHQ 2/9  Decreased Interest 0 0 0 0 0  Down, Depressed, Hopeless 0 0 0 0 0  PHQ - 2 Score 0 0 0 0 0  Altered sleeping 0      Tired, decreased energy 0      Change in appetite 0      Feeling bad or failure about yourself  0      Trouble concentrating 0      Moving slowly or fidgety/restless 0      Suicidal thoughts 0      PHQ-9 Score 0      Difficult doing work/chores Not difficult at all          09/16/2023    9:13 AM  GAD 7 : Generalized Anxiety Score  Nervous, Anxious, on Edge 0  Control/stop  worrying 0  Worry too much - different things 0  Trouble relaxing 0  Restless 0  Easily annoyed or irritable 0  Afraid - awful might happen 0  Total GAD 7 Score 0  Anxiety Difficulty Not difficult at all     Immunization History  Administered Date(s) Administered   Influenza,inj,Quad PF,6+ Mos 03/29/2020   Influenza-Unspecified 03/03/2021   Tdap 08/11/2009, 01/05/2020   Past Medical History:  Diagnosis Date   Dislocation of right elbow 02/22/2021   Endocervical polyp 07/24/2016   Hyperlipidemia    Nevus    Vitamin D deficiency    No Known Allergies Past Surgical History:  Procedure Laterality Date   CERVICAL POLYPECTOMY  01/2018   Family History  Problem Relation Age of Onset   Diabetes Mother    Hypertension Mother    Hypertension Father    Arthritis Sister    Hypertension Sister    Diabetes Maternal Aunt    Diabetes Maternal Uncle    Diabetes Paternal Aunt    Diabetes Paternal Uncle    Hypertension Paternal Uncle    Diabetes Maternal Grandmother    Diabetes Maternal Grandfather    Throat cancer Maternal Grandfather    Breast cancer Neg Hx  Colon cancer Neg Hx    Colon polyps Neg Hx    Esophageal cancer Neg Hx    Rectal cancer Neg Hx    Stomach cancer Neg Hx    Social History   Social History Narrative   Marital status/children/pets: married, 1 child        Allergies as of 09/16/2023   No Known Allergies      Medication List        Accurate as of September 16, 2023  9:41 AM. If you have any questions, ask your nurse or doctor.          ZINC PO Take by mouth.        All past medical history, surgical history, allergies, family history, immunizations andmedications were updated in the EMR today and reviewed under the history and medication portions of their EMR.     No results found for this or any previous visit (from the past 2160 hours).   ROS 14 pt review of systems performed and negative (unless mentioned in an  HPI)  Objective: BP 120/74   Pulse 88   Temp 98.3 F (36.8 C)   Wt 156 lb (70.8 kg)   LMP 09/08/2023   SpO2 97%   BMI 26.96 kg/m  Physical Exam Vitals and nursing note reviewed.  Constitutional:      General: She is not in acute distress.    Appearance: Normal appearance. She is not ill-appearing or toxic-appearing.  HENT:     Head: Normocephalic and atraumatic.     Right Ear: Tympanic membrane, ear canal and external ear normal. There is no impacted cerumen.     Left Ear: Tympanic membrane, ear canal and external ear normal. There is no impacted cerumen.     Nose: No congestion or rhinorrhea.     Mouth/Throat:     Mouth: Mucous membranes are moist.     Pharynx: Oropharynx is clear. No oropharyngeal exudate or posterior oropharyngeal erythema.  Eyes:     General: No scleral icterus.       Right eye: No discharge.        Left eye: No discharge.     Extraocular Movements: Extraocular movements intact.     Conjunctiva/sclera: Conjunctivae normal.     Pupils: Pupils are equal, round, and reactive to light.  Cardiovascular:     Rate and Rhythm: Normal rate and regular rhythm.     Pulses: Normal pulses.     Heart sounds: Normal heart sounds. No murmur heard.    No friction rub. No gallop.  Pulmonary:     Effort: Pulmonary effort is normal. No respiratory distress.     Breath sounds: Normal breath sounds. No stridor. No wheezing, rhonchi or rales.  Chest:     Chest wall: No tenderness.  Abdominal:     General: Abdomen is flat. Bowel sounds are normal. There is no distension.     Palpations: Abdomen is soft. There is no mass.     Tenderness: There is abdominal tenderness (mild tenderness right of midline lower abd. ?stool burden  vs fibroid). There is no right CVA tenderness, left CVA tenderness, guarding or rebound.     Hernia: No hernia is present.  Musculoskeletal:        General: No swelling, tenderness or deformity. Normal range of motion.     Cervical back: Normal range  of motion and neck supple. No rigidity or tenderness.     Right lower leg: No edema.  Left lower leg: No edema.  Lymphadenopathy:     Cervical: No cervical adenopathy.  Skin:    General: Skin is warm and dry.     Coloration: Skin is not jaundiced or pale.     Findings: No bruising, erythema, lesion or rash.  Neurological:     General: No focal deficit present.     Mental Status: She is alert and oriented to person, place, and time. Mental status is at baseline.     Cranial Nerves: No cranial nerve deficit.     Sensory: No sensory deficit.     Motor: No weakness.     Coordination: Coordination normal.     Gait: Gait normal.     Deep Tendon Reflexes: Reflexes normal.  Psychiatric:        Mood and Affect: Mood normal.        Behavior: Behavior normal.        Thought Content: Thought content normal.        Judgment: Judgment normal.      No results found.  Assessment/plan: Tina Boyd is a 53 y.o. female present for CPE  Routine general medical examination at a health care facility Patient was encouraged to exercise greater than 150 minutes a week. Patient was encouraged to choose a diet filled with fresh fruits and vegetables, and lean meats. AVS provided to patient today for education/recommendation on gender specific health and safety maintenance. Colon cancer screening: Cologuard completed 08/31/2021.  Repeat in 3 years Mammogram: Completed 10/04/2022 [facility] ordered Cervical cancer screening: last pap: last in system 04/2020 with neg HPV and NL pap. Due 2026 (pcp) Immunizations: tdap UTD 2021, Influenza (encouraged yearly), Shingrix declined.  Infectious disease screening: HIV and Hep C completed DEXA: routine screen  Abd discomfort: Mild TTP right of midline lower abd.  She has not had a BM yet today and typically has a BM daily.  We discussed monitoring area for a few days, and if abd still tender in this lx and does not become soft after a few Bms, to return within 1  week  for further eval and image.   Return in about 1 year (around 09/16/2024) for  cpe (20 min).  Orders Placed This Encounter  Procedures   MM 3D SCREENING MAMMOGRAM BILATERAL BREAST   CBC   Comprehensive metabolic panel with GFR   Hemoglobin A1c   TSH   Lipid panel   No orders of the defined types were placed in this encounter.  Referral Orders  No referral(s) requested today     Electronically signed by: Napolean Backbone, DO [facility]

## 2023-09-17 ENCOUNTER — Inpatient Hospital Stay: Admission: RE | Admit: 2023-09-17 | Source: Ambulatory Visit

## 2023-09-17 ENCOUNTER — Encounter: Payer: Self-pay | Admitting: Family Medicine

## 2023-10-09 ENCOUNTER — Encounter

## 2023-10-21 ENCOUNTER — Encounter: Payer: Self-pay | Admitting: Family Medicine

## 2023-10-21 ENCOUNTER — Ambulatory Visit: Admitting: Family Medicine

## 2023-10-21 VITALS — BP 116/74 | HR 78 | Temp 98.2°F | Wt 160.0 lb

## 2023-10-21 DIAGNOSIS — H1013 Acute atopic conjunctivitis, bilateral: Secondary | ICD-10-CM | POA: Insufficient documentation

## 2023-10-21 DIAGNOSIS — J301 Allergic rhinitis due to pollen: Secondary | ICD-10-CM | POA: Insufficient documentation

## 2023-10-21 MED ORDER — FLUTICASONE PROPIONATE 50 MCG/ACT NA SUSP
2.0000 | Freq: Two times a day (BID) | NASAL | 6 refills | Status: AC
Start: 2023-10-21 — End: ?

## 2023-10-21 MED ORDER — CETIRIZINE HCL 10 MG PO TABS: 10.0000 mg | ORAL_TABLET | Freq: Every day | ORAL | 11 refills | Status: AC

## 2023-10-21 MED ORDER — OLOPATADINE HCL 0.2 % OP SOLN: 1.0000 [drp] | Freq: Every day | OPHTHALMIC | 5 refills | Status: AC

## 2023-10-21 NOTE — Patient Instructions (Addendum)

## 2023-10-21 NOTE — Progress Notes (Signed)
 Tina Boyd , [birth year], 53 y.o., female MRN: [id number] Patient Care Team    Relationship Specialty Notifications Start End  Mariel Shope, DO PCP - General Family Medicine  01/05/20   Ivin Marrow, PA-C Physician Assistant Obstetrics and Gynecology  09/16/23   Ivin Marrow, PA-C Physician Assistant Obstetrics and Gynecology  09/16/23     Chief Complaint  Patient presents with   Allergies    2 weeks; watery eye/irritation, runny nose. Pt has been taking Allegra.      [name] is a 53 y.o. Pt presents for an OV with complaints of itchy watery eyes of 2 weeks duration.  Associated symptoms include runny nose.  Pt has tried allegra to ease their symptoms.      09/16/2023    9:12 AM 08/13/2022    9:04 AM 08/09/2021    9:52 AM 03/29/2020   10:34 AM 01/05/2020   11:37 AM  Depression screen PHQ 2/9  Decreased Interest 0 0 0 0 0  Down, Depressed, Hopeless 0 0 0 0 0  PHQ - 2 Score 0 0 0 0 0  Altered sleeping 0      Tired, decreased energy 0      Change in appetite 0      Feeling bad or failure about yourself  0      Trouble concentrating 0      Moving slowly or fidgety/restless 0      Suicidal thoughts 0      PHQ-9 Score 0      Difficult doing work/chores Not difficult at all        No Known Allergies Social History   Social History Narrative   Marital status/children/pets: married, 1 child       Past Medical History:  Diagnosis Date   Dislocation of right elbow 02/22/2021   Endocervical polyp 07/24/2016   Hyperlipidemia    Nevus    Vitamin D deficiency    Past Surgical History:  Procedure Laterality Date   CERVICAL POLYPECTOMY  01/2018   Family History  Problem Relation Age of Onset   Diabetes Mother    Hypertension Mother    Hypertension Father    Arthritis Sister    Hypertension Sister    Diabetes Maternal Aunt    Diabetes Maternal Uncle    Diabetes Paternal Aunt    Diabetes Paternal Uncle    Hypertension Paternal Uncle     Diabetes Maternal Grandmother    Diabetes Maternal Grandfather    Throat cancer Maternal Grandfather    Breast cancer Neg Hx    Colon cancer Neg Hx    Colon polyps Neg Hx    Esophageal cancer Neg Hx    Rectal cancer Neg Hx    Stomach cancer Neg Hx    Allergies as of 10/21/2023   No Known Allergies      Medication List        Accurate as of Oct 21, 2023  7:50 AM. If you have any questions, ask your nurse or doctor.          cetirizine 10 MG tablet Commonly known as: ZYRTEC Take 1 tablet (10 mg total) by mouth daily. Started by: Tamiah Dysart   fluticasone 50 MCG/ACT nasal spray Commonly known as: FLONASE Place 2 sprays into both nostrils in the morning and at bedtime. Started by: Jaeleigh Monaco   Olopatadine HCl 0.2 % Soln Apply 1 drop to eye daily. Each eye Started by: Temitope Flammer  ZINC PO Take by mouth.        All past medical history, surgical history, allergies, family history, immunizations andmedications were updated in the EMR today and reviewed under the history and medication portions of their EMR.     Review of Systems  Constitutional:  Negative for chills and fever.  HENT:  Negative for congestion, ear pain, sinus pain and sore throat.   Respiratory:  Negative for cough, shortness of breath and wheezing.   Neurological:  Negative for dizziness and headaches.   Negative, with the exception of above mentioned in HPI   Objective:  BP 116/74   Pulse 78   Temp 98.2 F (36.8 C)   Wt 160 lb (72.6 kg)   SpO2 97%   BMI 27.65 kg/m  Body mass index is 27.65 kg/m.  Physical Exam Vitals and nursing note reviewed.  Constitutional:      General: She is not in acute distress.    Appearance: Normal appearance. She is normal weight. She is not ill-appearing or toxic-appearing.  HENT:     Head: Normocephalic and atraumatic.     Right Ear: Tympanic membrane, ear canal and external ear normal. There is no impacted cerumen.     Left Ear: Tympanic  membrane, ear canal and external ear normal. There is no impacted cerumen.     Nose: Rhinorrhea present. No congestion.     Mouth/Throat:     Mouth: Mucous membranes are moist.     Pharynx: No oropharyngeal exudate or posterior oropharyngeal erythema.  Eyes:     General: No scleral icterus.       Right eye: No discharge.        Left eye: No discharge.     Extraocular Movements: Extraocular movements intact.     Pupils: Pupils are equal, round, and reactive to light.     Comments: Mild conjunctivitis bilateral  Musculoskeletal:     Cervical back: Neck supple.  Lymphadenopathy:     Cervical: No cervical adenopathy.  Skin:    Findings: No rash.  Neurological:     Mental Status: She is alert and oriented to person, place, and time. Mental status is at baseline.     Motor: No weakness.     Coordination: Coordination normal.     Gait: Gait normal.  Psychiatric:        Mood and Affect: Mood normal.        Behavior: Behavior normal.        Thought Content: Thought content normal.        Judgment: Judgment normal.      No results found. No results found. No results found for this or any previous visit (from the past 24 hours).  Assessment/Plan: Tina Boyd is a 53 y.o. female present for OV for  Seasonal allergic rhinitis due to pollen (Primary)/Allergic conjunctivitis of both eyes Nasal saline rinse BID.  Dc alegra, start xyzal at bedtime Start pataday eye drop Start flonase nasal spray bid.  F/u prn  Reviewed expectations re: course of current medical issues. Discussed self-management of symptoms. Outlined signs and symptoms indicating need for more acute intervention. Patient verbalized understanding and all questions were answered. Patient received an After-Visit Summary.    No orders of the defined types were placed in this encounter.  Meds ordered this encounter  Medications   fluticasone (FLONASE) 50 MCG/ACT nasal spray    Sig: Place 2 sprays into both  nostrils in the morning and at bedtime.    Dispense:  16 g    Refill:  6   Olopatadine HCl 0.2 % SOLN    Sig: Apply 1 drop to eye daily. Each eye    Dispense:  2.5 mL    Refill:  5   cetirizine (ZYRTEC) 10 MG tablet    Sig: Take 1 tablet (10 mg total) by mouth daily.    Dispense:  30 tablet    Refill:  11   Referral Orders  No referral(s) requested today     Note is dictated utilizing voice recognition software. Although note has been proof read prior to signing, occasional typographical errors still can be missed. If any questions arise, please do not hesitate to call for verification.   electronically signed by:  Napolean Backbone, DO  [facility] [place]

## 2023-11-09 IMAGING — MG MM DIGITAL SCREENING BILAT W/ TOMO AND CAD
6 of 10 series · 6 of 30 positions shown · non-contrast
Comparison: Previous exam(s).

CLINICAL DATA: Screening.

EXAM:
DIGITAL SCREENING BILATERAL MAMMOGRAM WITH TOMOSYNTHESIS AND CAD
TECHNIQUE: Bilateral screening digital craniocaudal and mediolateral oblique
mammograms were obtained. Bilateral screening digital breast
tomosynthesis was performed. The images were evaluated with
computer-aided detection.

[R CC synth-2D]
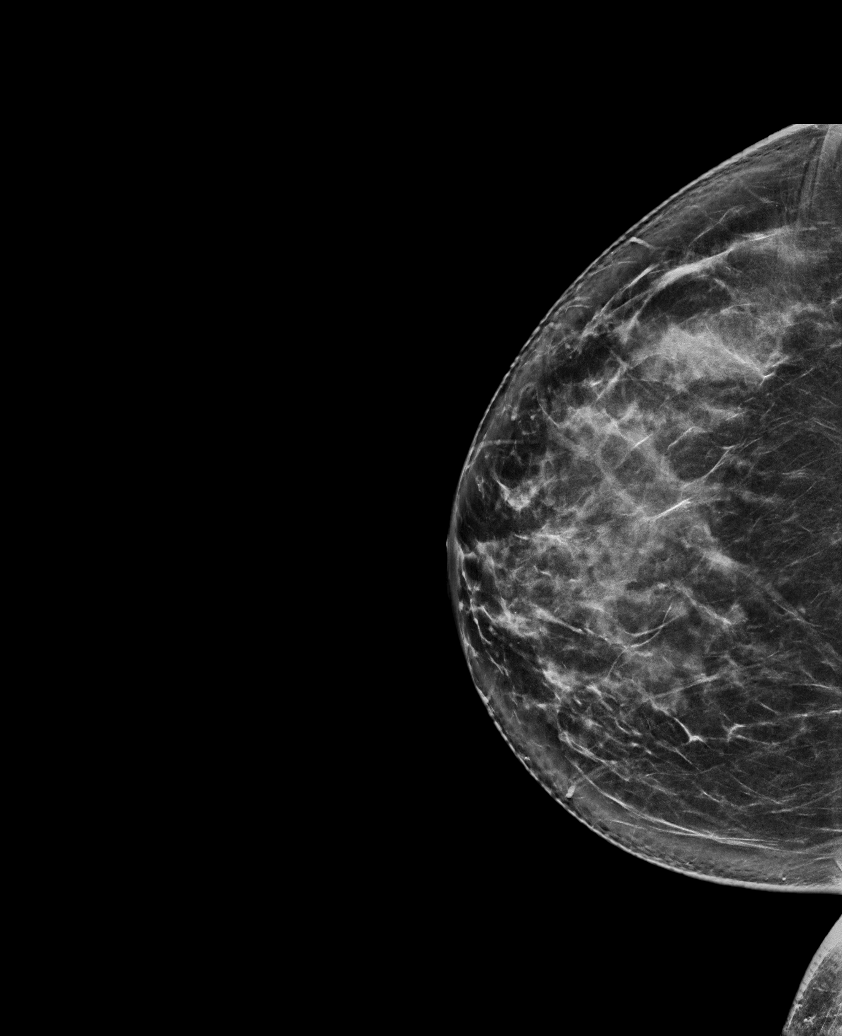

[R MLO synth-2D (1 of 2)]
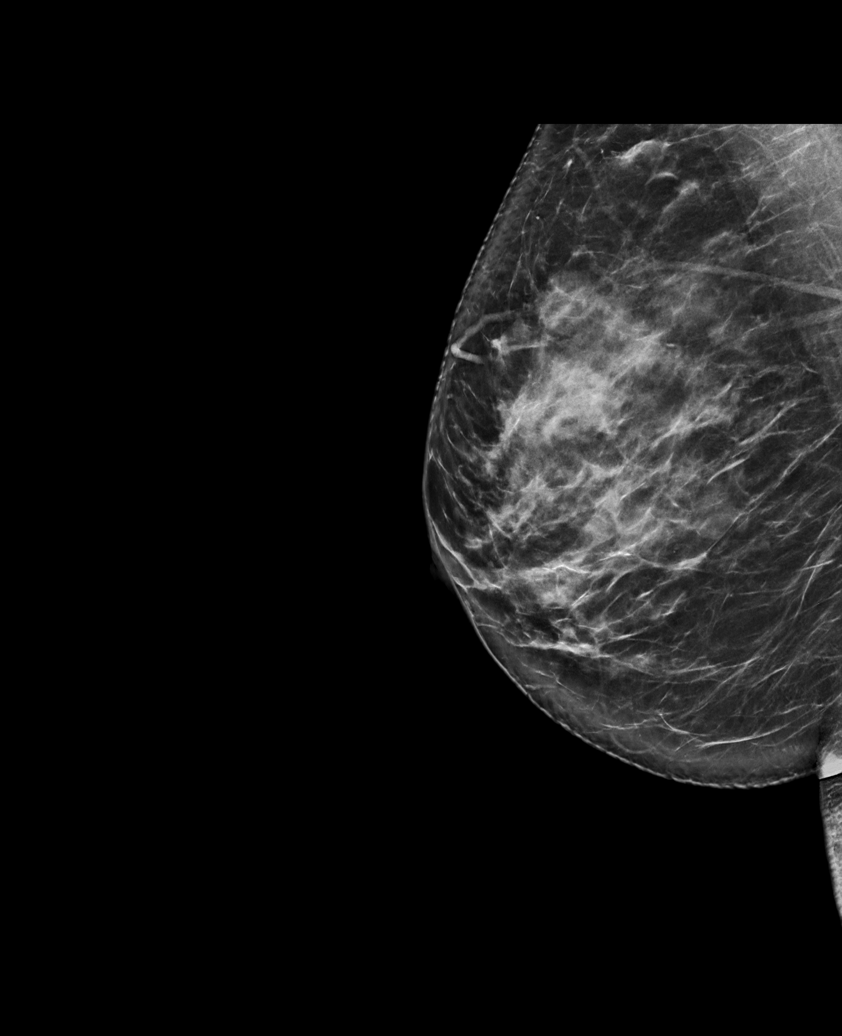

[R MLO synth-2D (2 of 2)]
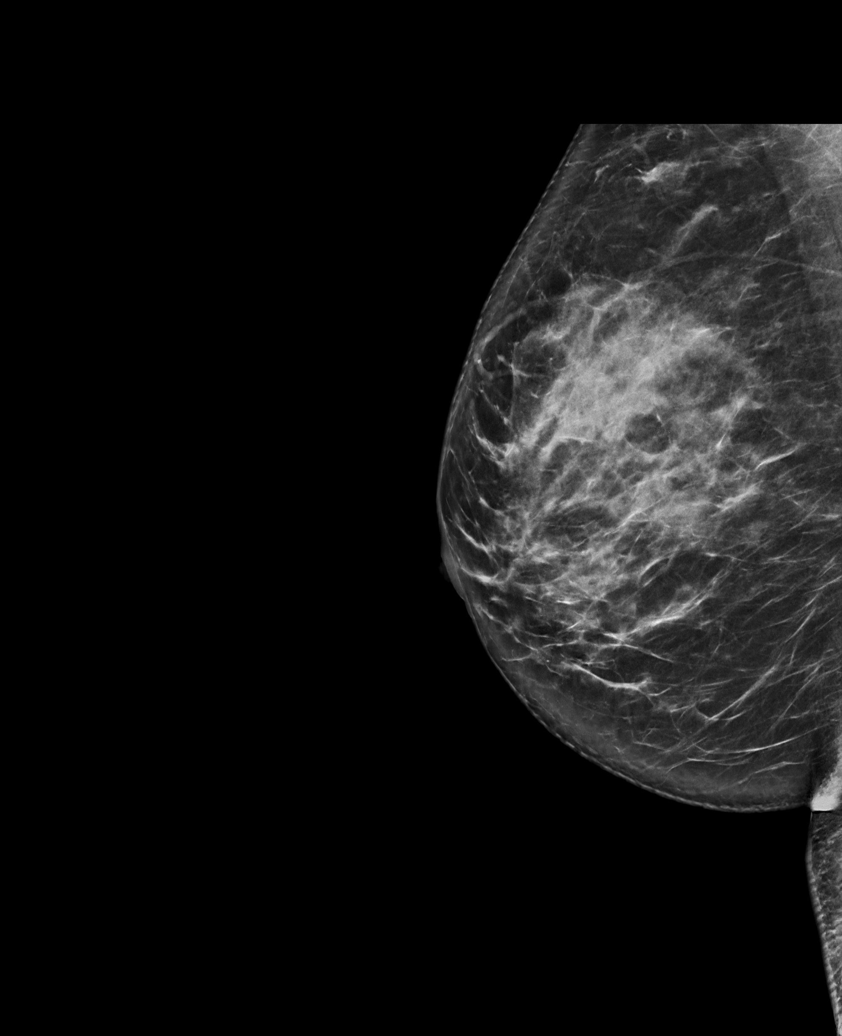

[L CC synth-2D]
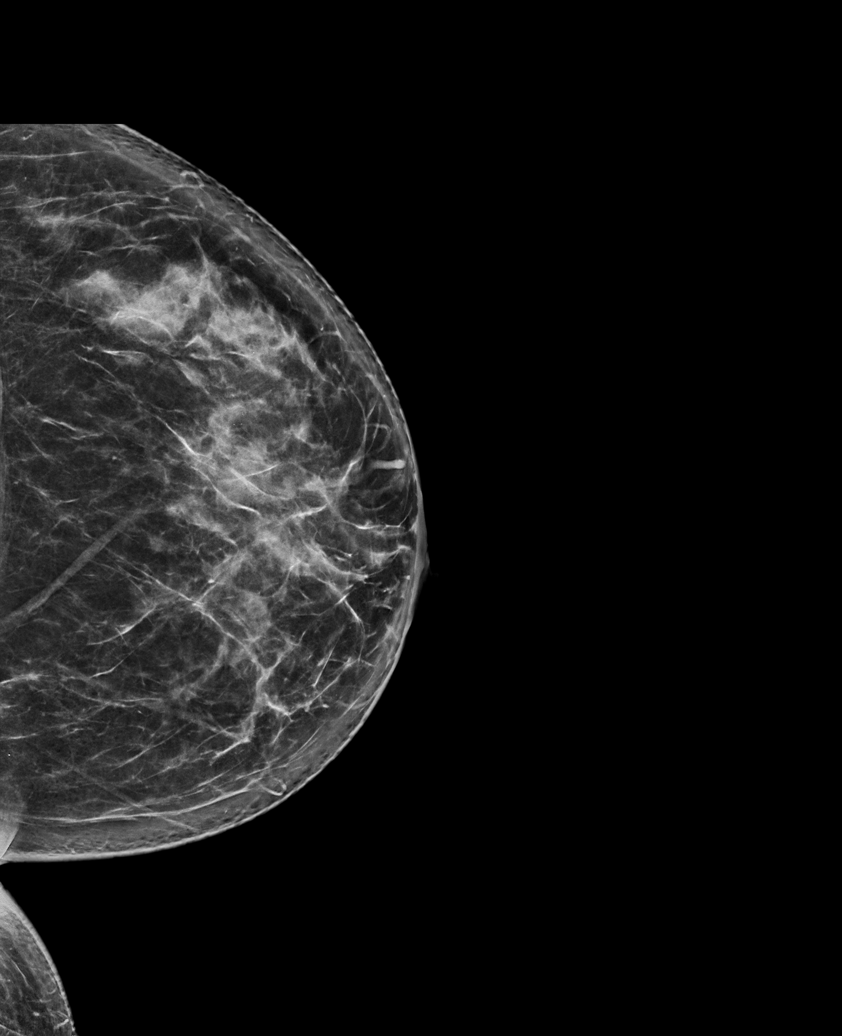

[L MLO synth-2D]
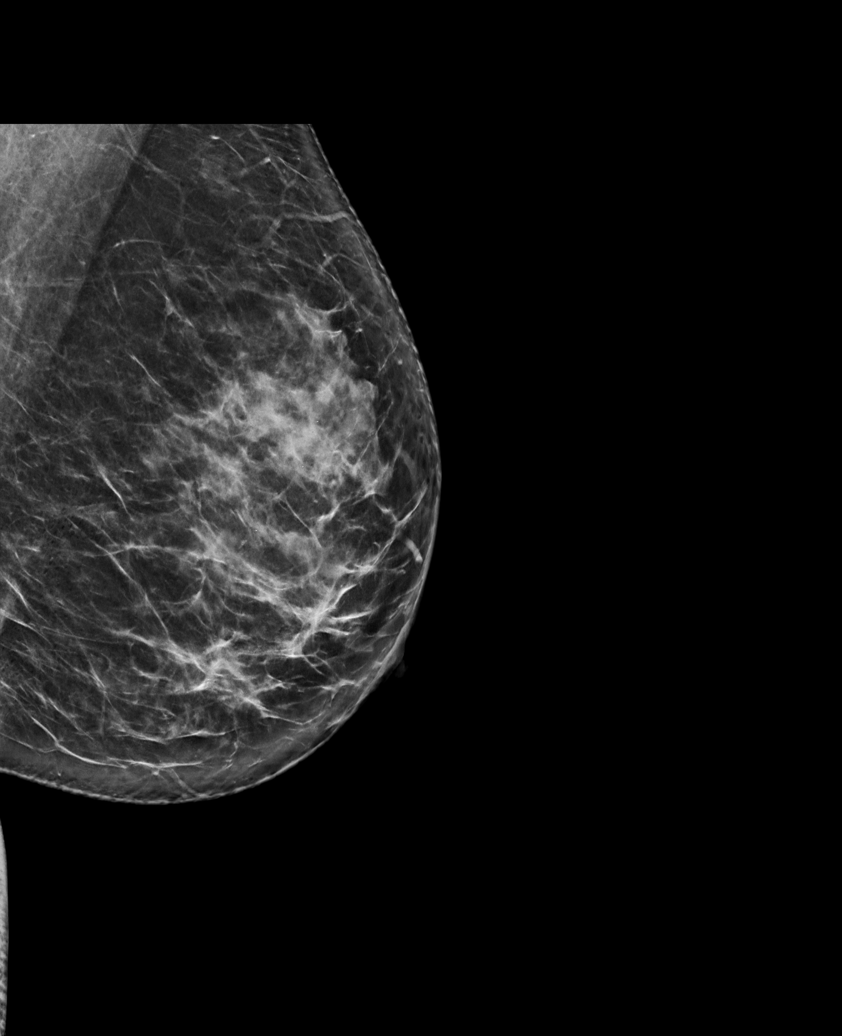

[R CC tomo · tomo slice 41/82.0]
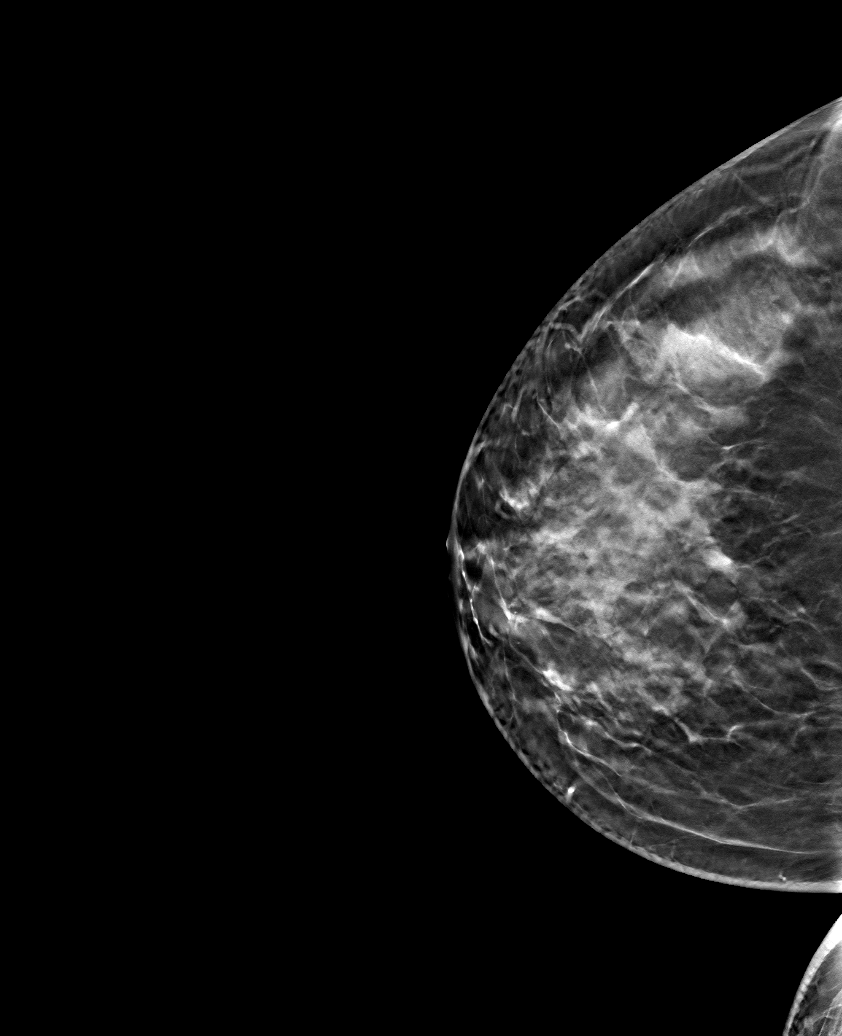

[6 of 30 positions shown; findings below may reference images not displayed]

ACR Breast Density Category c: The breast tissue is heterogeneously
dense, which may obscure small masses.
FINDINGS: There are no findings suspicious for malignancy.
IMPRESSION: No mammographic evidence of malignancy. A result letter of this
screening mammogram will be mailed directly to the patient.

RECOMMENDATION:
Screening mammogram in one year. (Code:Q3-W-BC3)

BI-RADS CATEGORY  1: Negative.

## 2023-11-11 ENCOUNTER — Ambulatory Visit
Admission: RE | Admit: 2023-11-11 | Discharge: 2023-11-11 | Disposition: A | Discharge status: Home / Self Care | Source: Ambulatory Visit | Attending: Family Medicine | Admitting: Family Medicine

## 2023-11-11 DIAGNOSIS — Z1231 Encounter for screening mammogram for malignant neoplasm of breast: Secondary | ICD-10-CM

## 2023-11-13 ENCOUNTER — Ambulatory Visit: Payer: Self-pay | Admitting: Family Medicine

## 2024-01-13 ENCOUNTER — Ambulatory Visit: Payer: Self-pay

## 2024-01-13 NOTE — Telephone Encounter (Signed)
 FYI Only or Action Required?: FYI only for provider.  Patient was last seen in primary care on 10/21/2023 by Catherine Fuller A, DO.  Called Nurse Triage reporting Back Pain.  Symptoms began 2 weeks ago.  Interventions attempted: Rest, hydration, or home remedies.  Symptoms are: unchanged.  Triage Disposition: See HCP Within 4 Hours (Or PCP Triage)  Patient/caregiver understands and will follow disposition?: No  Copied from CRM [id number]. Topic: Clinical - Red Word Triage >> Jan 13, 2024  8:48 AM Tina Boyd wrote: Red Word that prompted transfer to Nurse Triage: Patient is having lower back pain for about 2 weeks Reason for Disposition  Side (flank) or lower back pain present  [1] Pain or burning with passing urine (urination) AND [2] flank (e.g., in side of back, below ribs and above hip)  Answer Assessment - Initial Assessment Questions 1. ONSET: When did the pain begin? (e.g., minutes, hours, days)     2 weeks 2. LOCATION: Where does it hurt? (upper, mid or lower back)     Low R 3. SEVERITY: How bad is the pain?  (e.g., Scale 1-10; mild, moderate, or severe)     8 4. PATTERN: Is the pain constant? (e.g., yes, no; constant, intermittent)      intermittent 5. RADIATION: Does the pain shoot into your legs or somewhere else?     denies 6. CAUSE:  What do you think is causing the back pain?      uti 7. BACK OVERUSE:  Any recent lifting of heavy objects, strenuous work or exercise?     denies 8. MEDICINES: What have you taken so far for the pain? (e.g., nothing, acetaminophen, NSAIDS)     denies         Pt states she has burning with urination. Denies odor. Denies blood in urine. Denies fever. Pt states that she cannot come to appt today. Scheduled tomorrow.  Protocols used: Back Pain-A-AH, Urinary Symptoms-A-AH

## 2024-01-14 ENCOUNTER — Encounter: Payer: Self-pay | Admitting: Family Medicine

## 2024-01-14 ENCOUNTER — Ambulatory Visit: Admitting: Family Medicine

## 2024-01-14 VITALS — BP 118/78 | HR 79 | Temp 98.2°F | Wt 163.6 lb

## 2024-01-14 DIAGNOSIS — R1903 Right lower quadrant abdominal swelling, mass and lump: Secondary | ICD-10-CM

## 2024-01-14 DIAGNOSIS — R1031 Right lower quadrant pain: Secondary | ICD-10-CM

## 2024-01-14 DIAGNOSIS — R350 Frequency of micturition: Secondary | ICD-10-CM | POA: Diagnosis not present

## 2024-01-14 LAB — BASIC METABOLIC PANEL WITH GFR
BUN: 12 mg/dL (ref 6–23)
CO2: 27 meq/L (ref 19–32)
Calcium: 9 mg/dL (ref 8.4–10.5)
Chloride: 103 meq/L (ref 96–112)
Creatinine, Ser: 0.61 mg/dL (ref 0.40–1.20)
GFR: 101.25 mL/min (ref >60.00)
Glucose, Bld: 89 mg/dL (ref 70–99)
Potassium: 4.2 meq/L (ref 3.5–5.1)
Sodium: 135 meq/L (ref 135–145)

## 2024-01-14 LAB — CBC WITH DIFFERENTIAL/PLATELET
Basophils Absolute: 0 K/uL (ref 0.0–0.1)
Basophils Relative: 0.5 % (ref 0.0–3.0)
Eosinophils Absolute: 0 K/uL (ref 0.0–0.7)
Eosinophils Relative: 0.7 % (ref 0.0–5.0)
HCT: 41 % (ref 36.0–46.0)
Hemoglobin: 13 g/dL (ref 12.0–15.0)
Lymphocytes Relative: 35.3 % (ref 12.0–46.0)
Lymphs Abs: 2 K/uL (ref 0.7–4.0)
MCHC: 31.8 g/dL (ref 30.0–36.0)
MCV: 84.7 fl (ref 78.0–100.0)
Monocytes Absolute: 0.4 K/uL (ref 0.1–1.0)
Monocytes Relative: 7.2 % (ref 3.0–12.0)
Neutro Abs: 3.2 K/uL (ref 1.4–7.7)
Neutrophils Relative %: 56.3 % (ref 43.0–77.0)
Platelets: 275 K/uL (ref 150.0–400.0)
RBC: 4.83 Mil/uL (ref 3.87–5.11)
RDW: 15.6 % — ABNORMAL HIGH (ref 11.5–15.5)
WBC: 5.6 K/uL (ref 4.0–10.5)

## 2024-01-14 LAB — POC URINALSYSI DIPSTICK (AUTOMATED)
Bilirubin, UA: NEGATIVE
Glucose, UA: NEGATIVE
Ketones, UA: NEGATIVE
Leukocytes, UA: NEGATIVE
Nitrite, UA: NEGATIVE
Protein, UA: NEGATIVE
Spec Grav, UA: 1.01 (ref 1.010–1.025)
Urobilinogen, UA: NEGATIVE U/dL — AB
pH, UA: 6 (ref 5.0–8.0)

## 2024-01-14 LAB — POCT URINE PREGNANCY: Preg Test, Ur: NEGATIVE

## 2024-01-14 MED ORDER — CEPHALEXIN 500 MG PO CAPS: 500.0000 mg | ORAL_CAPSULE | Freq: Three times a day (TID) | ORAL | 0 refills | Status: DC

## 2024-01-14 NOTE — Patient Instructions (Addendum)
 The image center at [facility] will call you to schedule a CT We will call you with labs and image results once we receive them. Go ahead and start the Keflex  1 tab every 8 hours today. Hydrate well.         Great to see you today.  I have refilled the medication(s) we provide.   If labs were collected or images ordered, we will inform you of  results once we have received them and reviewed. We will contact you either by echart message, or telephone call.  Please give ample time to the testing facility, and our office to run,  receive and review results. Please do not call inquiring of results, even if you can see them in your chart. We will contact you as soon as we are able. If it has been over 1 week since the test was completed, and you have not yet heard from us , then please call us .    - echart message- for normal results that have been seen by the patient already.   - telephone call: abnormal results or if patient has not viewed results in their echart.  If a referral to a specialist was entered for you, please call us  in 2 weeks if you have not heard from the specialist office to schedule.

## 2024-01-14 NOTE — Progress Notes (Signed)
 Tina Boyd , [birth year], 53 y.o., female MRN: [id number] Patient Care Team    Relationship Specialty Notifications Start End  Catherine Charlies LABOR, DO PCP - General Family Medicine  01/05/20   Perri Bjork, PA-C Physician Assistant Obstetrics and Gynecology  09/16/23   Perri Bjork, PA-C Physician Assistant Obstetrics and Gynecology  09/16/23     Chief Complaint  Patient presents with   Dysuria    2 weeks; Lower abdominal pain, burning sensation, urinary frequency. Pt has tried AZO.      [name] is a 53 y.o. Pt presents for an OV with complaints of dysuria and urinary frequency of 2 weeks duration.  Associated symptoms include right-sided low back pain, right sided suprapubic pain and mild flank pain. Patient reports she did have a kidney stone about 10 years ago, this does not feel like the kidney stone does. Patient denies fever, chills, nausea or vomiting. Patient reports she is having normal routine bowel movements daily. Patient is having menometrorrhagia and being evaluated by her gynecology.  She does not recall being told if she had fibroids.  Pt has tried Azo to ease their symptoms.   Patient's last menstrual period was 12/25/2023 (exact date).      09/16/2023    9:12 AM 08/13/2022    9:04 AM 08/09/2021    9:52 AM 03/29/2020   10:34 AM 01/05/2020   11:37 AM  Depression screen PHQ 2/9  Decreased Interest 0 0 0 0 0  Down, Depressed, Hopeless 0 0 0 0 0  PHQ - 2 Score 0 0 0 0 0  Altered sleeping 0      Tired, decreased energy 0      Change in appetite 0      Feeling bad or failure about yourself  0      Trouble concentrating 0      Moving slowly or fidgety/restless 0      Suicidal thoughts 0      PHQ-9 Score 0      Difficult doing work/chores Not difficult at all        No Known Allergies Social History   Social History Narrative   Marital status/children/pets: married, 1 child       Past Medical History:  Diagnosis Date    Dislocation of right elbow 02/22/2021   Endocervical polyp 07/24/2016   Hyperlipidemia    Nevus    Vitamin D deficiency    Past Surgical History:  Procedure Laterality Date   CERVICAL POLYPECTOMY  01/2018   Family History  Problem Relation Age of Onset   Diabetes Mother    Hypertension Mother    Hypertension Father    Arthritis Sister    Hypertension Sister    Diabetes Maternal Aunt    Diabetes Maternal Uncle    Diabetes Paternal Aunt    Diabetes Paternal Uncle    Hypertension Paternal Uncle    Diabetes Maternal Grandmother    Diabetes Maternal Grandfather    Throat cancer Maternal Grandfather    Breast cancer Neg Hx    Colon cancer Neg Hx    Colon polyps Neg Hx    Esophageal cancer Neg Hx    Rectal cancer Neg Hx    Stomach cancer Neg Hx    Allergies as of 01/14/2024   No Known Allergies      Medication List        Accurate as of January 14, 2024  1:00 PM. If you have any  questions, ask your nurse or doctor.          cephALEXin  500 MG capsule Commonly known as: KEFLEX  Take 1 capsule (500 mg total) by mouth 3 (three) times daily. Started by: Charlies Bellini   cetirizine  10 MG tablet Commonly known as: ZYRTEC  Take 1 tablet (10 mg total) by mouth daily.   fluticasone  50 MCG/ACT nasal spray Commonly known as: FLONASE  Place 2 sprays into both nostrils in the morning and at bedtime.   Olopatadine  HCl 0.2 % Soln Apply 1 drop to eye daily. Each eye   ZINC PO Take by mouth.        All past medical history, surgical history, allergies, family history, immunizations andmedications were updated in the EMR today and reviewed under the history and medication portions of their EMR.     ROS Negative, with the exception of above mentioned in HPI   Objective:  BP 118/78   Pulse 79   Temp 98.2 F (36.8 C)   Wt 163 lb 9.6 oz (74.2 kg)   LMP 12/25/2023 (Exact Date)   SpO2 98%   BMI 28.28 kg/m  Body mass index is 28.28 kg/m. Physical Exam Vitals and  nursing note reviewed.  Constitutional:      General: She is not in acute distress.    Appearance: Normal appearance. She is not ill-appearing, toxic-appearing or diaphoretic.  HENT:     Head: Normocephalic and atraumatic.  Eyes:     General: No scleral icterus.       Right eye: No discharge.        Left eye: No discharge.     Extraocular Movements: Extraocular movements intact.     Conjunctiva/sclera: Conjunctivae normal.     Pupils: Pupils are equal, round, and reactive to light.  Cardiovascular:     Rate and Rhythm: Normal rate and regular rhythm.  Pulmonary:     Effort: Pulmonary effort is normal. No respiratory distress.     Breath sounds: Normal breath sounds. No wheezing, rhonchi or rales.  Abdominal:     General: Abdomen is flat. Bowel sounds are normal. There is no distension.     Palpations: Abdomen is soft. There is mass.     Tenderness: There is abdominal tenderness. There is no right CVA tenderness, left CVA tenderness, guarding or rebound.   Musculoskeletal:     Right lower leg: No edema.     Left lower leg: No edema.  Skin:    General: Skin is warm.     Findings: No rash.  Neurological:     Mental Status: She is alert and oriented to person, place, and time. Mental status is at baseline.     Motor: No weakness.     Gait: Gait normal.  Psychiatric:        Mood and Affect: Mood normal.        Behavior: Behavior normal.        Thought Content: Thought content normal.        Judgment: Judgment normal.      No results found. No results found. Results for orders placed or performed in visit on 01/14/24 (from the past 24 hours)  POCT Urinalysis Dipstick (Automated)     Status: Abnormal   Collection Time: 01/14/24 11:27 AM  Result Value Ref Range   Color, UA yellow    Clarity, UA clear    Glucose, UA Negative Negative   Bilirubin, UA negative    Ketones, UA negative    Spec Grav,  UA 1.010 1.010 - 1.025   Blood, UA none    pH, UA 6.0 5.0 - 8.0   Protein,  UA Negative Negative   Urobilinogen, UA negative (A) 0.2 or 1.0 E.U./dL   Nitrite, UA negative    Leukocytes, UA Negative Negative  POCT urine pregnancy     Status: None   Collection Time: 01/14/24 12:59 PM  Result Value Ref Range   Preg Test, Ur Negative Negative    Assessment/Plan: Tina Boyd is a 53 y.o. female present for OV for  Abdominal pain/urinary frequency/abdominal mass -Hydrate well - Uncertain etiology of patient's discomfort.  She complains of right lower back intermittent pain and right flank/right abdomen pain.  She is tender to palpation right of midline in the suprapubic region where there is a mass palpated.  We discussed the possibility of a mass of unknown etiology, versus large stool burden, versus fibroid uterus.  Will need to obtain a CT abdomen urgently to rule out kidney stones or mass as cause. - Start Keflex  3 times daily x 7 days prophylactically.  Urine point-of-care does not appear infectious and no blood. -Urine culture pending -CBC, BMP and urine pregnancy test collected today. (Preg negative) -Patient will be called with labs and image results, further plan and follow-up we discussed that time Reviewed expectations re: course of current medical issues. Discussed self-management of symptoms. Outlined signs and symptoms indicating need for more acute intervention. Patient verbalized understanding and all questions were answered. Patient received an After-Visit Summary.  43 minutes spent on day of service during patient encounter to workup urgent abdominal pain with overall possible kidney stone versus mass require urgent imaging and labs.   Orders Placed This Encounter  Procedures   CT ABDOMEN PELVIS W CONTRAST   Urinalysis w microscopic + reflex cultur   Basic Metabolic Panel (BMET)   CBC w/Diff   POCT Urinalysis Dipstick (Automated)   POCT urine pregnancy   Meds ordered this encounter  Medications   cephALEXin  (KEFLEX ) 500 MG capsule     Sig: Take 1 capsule (500 mg total) by mouth 3 (three) times daily.    Dispense:  21 capsule    Refill:  0   Referral Orders  No referral(s) requested today     Note is dictated utilizing voice recognition software. Although note has been proof read prior to signing, occasional typographical errors still can be missed. If any questions arise, please do not hesitate to call for verification.   electronically signed by:  Charlies Bellini, DO   Primary Care - [place]

## 2024-01-15 ENCOUNTER — Ambulatory Visit: Payer: Self-pay | Admitting: Family Medicine

## 2024-01-15 LAB — URINALYSIS W MICROSCOPIC + REFLEX CULTURE
Bacteria, UA: NONE SEEN /HPF
Bilirubin Urine: NEGATIVE
Glucose, UA: NEGATIVE
Hgb urine dipstick: NEGATIVE
Hyaline Cast: NONE SEEN /LPF
Ketones, ur: NEGATIVE
Leukocyte Esterase: NEGATIVE
Nitrites, Initial: NEGATIVE
Protein, ur: NEGATIVE
RBC / HPF: NONE SEEN /HPF (ref 0–2)
Specific Gravity, Urine: 1.009 (ref 1.001–1.035)
pH: 6 (ref 5.0–8.0)

## 2024-01-15 LAB — NO CULTURE INDICATED

## 2024-04-13 ENCOUNTER — Encounter (HOSPITAL_BASED_OUTPATIENT_CLINIC_OR_DEPARTMENT_OTHER): Payer: Self-pay

## 2024-04-13 ENCOUNTER — Emergency Department (HOSPITAL_BASED_OUTPATIENT_CLINIC_OR_DEPARTMENT_OTHER)
Admission: EM | Admit: 2024-04-13 | Discharge: 2024-04-13 | Disposition: A | Discharge status: Home / Self Care | Attending: Emergency Medicine | Admitting: Emergency Medicine

## 2024-04-13 DIAGNOSIS — L03115 Cellulitis of right lower limb: Secondary | ICD-10-CM | POA: Insufficient documentation

## 2024-04-13 DIAGNOSIS — M79604 Pain in right leg: Secondary | ICD-10-CM | POA: Diagnosis present

## 2024-04-13 MED ORDER — DOXYCYCLINE HYCLATE 100 MG PO TABS
100.0000 mg | ORAL_TABLET | Freq: Once | ORAL | Status: AC
  Administered 2024-04-13: 100 mg via ORAL
  Filled 2024-04-13: qty 1

## 2024-04-13 MED ORDER — DOXYCYCLINE HYCLATE 100 MG PO CAPS: 100.0000 mg | ORAL_CAPSULE | Freq: Two times a day (BID) | ORAL | 0 refills | Status: DC

## 2024-04-13 NOTE — ED Triage Notes (Signed)
 Pt reports rt. Lower leg pain Started yesterday, no injury Redness noted anteriorly

## 2024-04-13 NOTE — ED Provider Notes (Signed)
 [place] EMERGENCY DEPARTMENT AT [facility] Provider Note   CSN: [id number] Arrival date & time: 04/13/24  0023     Patient presents with: Leg Pain   Tina Boyd is a 53 y.o. female.   Patient presents to the emergency department for evaluation of leg pain.  Patient reports that she noticed pain in the lower right leg within the last day or so.  She reports that she worked really hard the last few days, thought she just pulled a muscle.  Today, however, the area is red and warm to the touch.  Denies any direct injury.       Prior to Admission medications   Medication Sig Start Date End Date Taking? Authorizing Provider  doxycycline (VIBRAMYCIN) 100 MG capsule Take 1 capsule (100 mg total) by mouth 2 (two) times daily. 04/13/24  Yes Daelen Belvedere, Lonni PARAS, MD  cephALEXin  (KEFLEX ) 500 MG capsule Take 1 capsule (500 mg total) by mouth 3 (three) times daily. 01/14/24   Kuneff, Renee A, DO  cetirizine  (ZYRTEC ) 10 MG tablet Take 1 tablet (10 mg total) by mouth daily. 10/21/23   Kuneff, Renee A, DO  fluticasone  (FLONASE ) 50 MCG/ACT nasal spray Place 2 sprays into both nostrils in the morning and at bedtime. 10/21/23   Kuneff, Renee A, DO  Multiple Vitamins-Minerals (ZINC PO) Take by mouth.    [provider]  Olopatadine  HCl 0.2 % SOLN Apply 1 drop to eye daily. Each eye 10/21/23   Catherine Fuller A, DO    Allergies: Patient has no known allergies.    Review of Systems  Updated Vital Signs BP (!) 152/86 (BP Location: Right Arm)   Pulse 73   Temp (!) 97.5 F (36.4 C)   Resp 17   Ht 5' 5 (1.651 m)   Wt 69.9 kg   LMP 03/25/2024 (Exact Date)   SpO2 100%   BMI 25.63 kg/m   Physical Exam Vitals and nursing note reviewed.  Constitutional:      Appearance: Normal appearance.  HENT:     Head: Normocephalic and atraumatic.  Cardiovascular:     Rate and Rhythm: Normal rate.     Pulses:          Dorsalis pedis pulses are 2+ on the right side.  Pulmonary:      Effort: Pulmonary effort is normal.  Musculoskeletal:        General: Normal range of motion.     Right lower leg: Swelling (Anterior lower, associated with erythema and warmth) and tenderness present. No deformity.  Skin:    Findings: Erythema (4 cm x 6 cm area anterior aspect of lower portion of right lower leg) present.  Neurological:     Mental Status: She is alert.     Cranial Nerves: Cranial nerves 2-12 are intact.     Sensory: Sensation is intact.     Motor: Motor function is intact.     (all labs ordered are listed, but only abnormal results are displayed) Labs Reviewed - No data to display  EKG: None  Radiology: No results found.   Procedures   Medications Ordered in the ED  doxycycline (VIBRA-TABS) tablet 100 mg (has no administration in time range)                                    Medical Decision Making Risk Prescription drug management.   Differential diagnosis considered includes, but not  limited to:  Orthopedic injury; arthritis; septic arthritis; ischemic limb; DVT; cellulitis; phlebitis   Presents with atraumatic pain of the right lower shin area.  Patient has normal distal pulses.  Calf is soft, nontender.  Area of pain is tender with slight swelling, erythema and increased warmth compared to the other side.  Suspect early cellulitis.  Treat empirically.  Patient appears well with normal vitals.     Final diagnoses:  Cellulitis of right lower extremity    ED Discharge Orders          Ordered    doxycycline (VIBRAMYCIN) 100 MG capsule  2 times daily        04/13/24 0112               Deshanna Kama J, MD 04/13/24 [phone]

## 2024-06-15 ENCOUNTER — Encounter: Payer: Self-pay | Admitting: Family Medicine

## 2024-06-15 ENCOUNTER — Ambulatory Visit: Payer: Self-pay

## 2024-06-15 ENCOUNTER — Other Ambulatory Visit: Payer: Self-pay

## 2024-06-15 ENCOUNTER — Ambulatory Visit: Admitting: Family Medicine

## 2024-06-15 VITALS — BP 134/84 | HR 99 | Temp 97.3°F | Ht 65.0 in | Wt 161.4 lb

## 2024-06-15 DIAGNOSIS — J040 Acute laryngitis: Secondary | ICD-10-CM

## 2024-06-15 NOTE — Telephone Encounter (Signed)
 FYI Only or Action Required?: FYI only for provider: appointment scheduled on 06/15/24.  Patient was last seen in primary care on 01/14/2024 by Catherine Fuller A, DO.  Called Nurse Triage reporting Cough.  Symptoms began several days ago.  Interventions attempted: OTC medications: Mucinex.  Symptoms are: gradually worsening.  Triage Disposition: See Physician Within 24 Hours  Patient/caregiver understands and will follow disposition?: Yes  Reason for Disposition  [1] Continuous (nonstop) coughing interferes with work or school AND [2] no improvement using cough treatment per Care Advice  Answer Assessment - Initial Assessment Questions Pt's friend Mace calling in today to schedule appt. Taking Mucinex, no relief.   1. ONSET: When did the cough begin?      Over the weekend  2. SEVERITY: How bad is the cough today?      Worsening  3. SPUTUM: Describe the color of your sputum (e.g., none, dry cough; clear, white, yellow, green)     Dry cough  4. HEMOPTYSIS: Are you coughing up any blood? If Yes, ask: How much? (e.g., flecks, streaks, tablespoons, etc.)     Denies  5. DIFFICULTY BREATHING: Are you having difficulty breathing? If Yes, ask: How bad is it? (e.g., mild, moderate, severe)      Denies  6. FEVER: Do you have a fever? If Yes, ask: What is your temperature, how was it measured, and when did it start?     Denies  7. CARDIAC HISTORY: Do you have any history of heart disease? (e.g., heart attack, congestive heart failure)      Denies  8. LUNG HISTORY: Do you have any history of lung disease?  (e.g., pulmonary embolus, asthma, emphysema)     Denies  9. OTHER SYMPTOMS: Do you have any other symptoms? (e.g., runny nose, wheezing, chest pain)       Lost voice, body aches, headache, sore throat  Protocols used: Cough - Acute Productive-A-AH  Copied from CRM #[id number]. Topic: Clinical - Red Word Triage >> Jun 15, 2024  8:22 AM Frederich PARAS wrote: [employer] that prompted transfer to Nurse Triage: bodypain, head pain  friend mace called in on pt behalf to schedule pt an apt. sick, pt started like coughing symptoms, worsened over weeend, lost voice, headache, body pain, inside fever. she i taking mucinx but doesnt work .

## 2024-06-15 NOTE — Progress Notes (Signed)
 OFFICE VISIT  06/15/2024  CC:  Chief Complaint  Patient presents with   Cough    Productive with green phlegm mostly at night; using Mucinex; sore throat, body aches; denies ear pain, headache   Patient is a 54 y.o. female who presents for cough.  HPI: Onset 4 days ago, cough and hoarse voice and general body aches and fatigue.  No fever, wheezing, shortness of breath, or rash.  She has a bit of sore throat.  No nasal congestion or postnasal drip.  She is eating and drinking well. No known flu or COVID exposure. She had to miss work some because of complete loss of voice starting yesterday.  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  +Hx allergic rhinitis--->on flonase  and zyrtec   Past Medical History:  Diagnosis Date   Dislocation of right elbow 02/22/2021   Endocervical polyp 07/24/2016   Hyperlipidemia    Nevus    Vitamin D deficiency     Past Surgical History:  Procedure Laterality Date   CERVICAL POLYPECTOMY  01/2018    Outpatient Medications Prior to Visit  Medication Sig Dispense Refill   cetirizine  (ZYRTEC ) 10 MG tablet Take 1 tablet (10 mg total) by mouth daily. 30 tablet 11   fluticasone  (FLONASE ) 50 MCG/ACT nasal spray Place 2 sprays into both nostrils in the morning and at bedtime. 16 g 6   Multiple Vitamins-Minerals (ZINC PO) Take by mouth.     Olopatadine  HCl 0.2 % SOLN Apply 1 drop to eye daily. Each eye 2.5 mL 5   No facility-administered medications prior to visit.    Allergies[1]  Review of Systems  As per HPI  PE:    06/15/2024   10:39 AM 06/15/2024   10:34 AM 04/13/2024   12:32 AM  Vitals with BMI  Height  5' 5 5' 5  Weight  161 lbs 6 oz 154 lbs  BMI  26.86 25.63  Systolic 134 142   Diastolic 84 90   Pulse  99      Physical Exam  Gen: Alert, well appearing.  Patient is oriented to person, place, time, and situation. AFFECT: pleasant, lucid thought and speech. ENT: Ears: EACs clear, normal epithelium.  TMs with good light reflex and  landmarks bilaterally.  Eyes: no injection, icteris, swelling, or exudate.  EOMI, PERRLA. Nose: no drainage or turbinate edema/swelling.  No injection or focal lesion.  Mouth: lips without lesion/swelling.  Oral mucosa pink and moist.  Dentition intact and without obvious caries or gingival swelling.  Oropharynx without erythema, exudate, or swelling.  Neck: no stridor, no tenderness, no LAD. CV: RRR, no m/r/g.   LUNGS: CTA bilat, nonlabored resps, good aeration in all lung fields.  LABS:  Last metabolic panel Lab Results  Component Value Date   GLUCOSE 89 01/14/2024   NA 135 01/14/2024   K 4.2 01/14/2024   CL 103 01/14/2024   CO2 27 01/14/2024   BUN 12 01/14/2024   CREATININE 0.61 01/14/2024   GFR 101.25 01/14/2024   CALCIUM 9.0 01/14/2024   PROT 6.7 09/16/2023   ALBUMIN 4.5 09/16/2023   BILITOT 0.7 09/16/2023   ALKPHOS 75 09/16/2023   AST 18 09/16/2023   ALT 19 09/16/2023   IMPRESSION AND PLAN:  Viral laryngitis. Recommended complete voice rest for the next 2 days. She says she does not need any symptomatic care for her cough.  An After Visit Summary was printed and given to the patient.  FOLLOW UP: Return if symptoms worsen or fail  to improve in 3-5 days.  Signed:  Gerlene Hockey, MD           06/15/2024     [1] No Known Allergies

## 2024-09-16 ENCOUNTER — Encounter: Admitting: Family Medicine
